# Patient Record
Sex: Female | Born: 1935 | Race: White | Hispanic: No | Marital: Married | Smoking: Never smoker
Health system: Southern US, Community
[De-identification: ages and names within clinical notes are randomized; demographics above are authoritative.]

## PROBLEM LIST (undated history)

## (undated) DIAGNOSIS — I495 Sick sinus syndrome: Secondary | ICD-10-CM

## (undated) DIAGNOSIS — I480 Paroxysmal atrial fibrillation: Secondary | ICD-10-CM

## (undated) HISTORY — DX: Paroxysmal atrial fibrillation: I48.0

## (undated) HISTORY — DX: Sick sinus syndrome: I49.5

---
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
## 1993-01-08 HISTORY — PX: EXCISION MORTON'S NEUROMA: SHX5013

## 1997-10-18 ENCOUNTER — Other Ambulatory Visit: Admission: RE | Admit: 1997-10-18 | Discharge: 1997-10-18 | Payer: Self-pay | Admitting: Family Medicine

## 1997-11-24 ENCOUNTER — Ambulatory Visit (HOSPITAL_COMMUNITY): Admission: RE | Admit: 1997-11-24 | Discharge: 1997-11-24 | Payer: Self-pay | Admitting: Family Medicine

## 1998-09-28 ENCOUNTER — Inpatient Hospital Stay (HOSPITAL_COMMUNITY): Admission: EM | Admit: 1998-09-28 | Discharge: 1998-09-30 | Payer: Self-pay | Admitting: *Deleted

## 1998-10-24 ENCOUNTER — Encounter: Admission: RE | Admit: 1998-10-24 | Discharge: 1998-10-24 | Payer: Self-pay | Admitting: Family Medicine

## 1998-12-30 ENCOUNTER — Emergency Department (HOSPITAL_COMMUNITY): Admission: EM | Admit: 1998-12-30 | Discharge: 1998-12-30 | Payer: Self-pay | Admitting: Emergency Medicine

## 1998-12-30 ENCOUNTER — Encounter: Payer: Self-pay | Admitting: Emergency Medicine

## 1999-01-06 ENCOUNTER — Emergency Department (HOSPITAL_COMMUNITY): Admission: EM | Admit: 1999-01-06 | Discharge: 1999-01-06 | Payer: Self-pay | Admitting: Emergency Medicine

## 1999-01-13 ENCOUNTER — Emergency Department (HOSPITAL_COMMUNITY): Admission: EM | Admit: 1999-01-13 | Discharge: 1999-01-13 | Payer: Self-pay | Admitting: Emergency Medicine

## 1999-01-13 ENCOUNTER — Encounter: Payer: Self-pay | Admitting: Emergency Medicine

## 1999-04-20 ENCOUNTER — Encounter: Payer: Self-pay | Admitting: Family Medicine

## 1999-04-20 ENCOUNTER — Encounter: Admission: RE | Admit: 1999-04-20 | Discharge: 1999-04-20 | Payer: Self-pay | Admitting: Family Medicine

## 1999-08-25 ENCOUNTER — Ambulatory Visit (HOSPITAL_COMMUNITY): Admission: RE | Admit: 1999-08-25 | Discharge: 1999-08-25 | Payer: Self-pay | Admitting: *Deleted

## 1999-08-25 ENCOUNTER — Encounter: Payer: Self-pay | Admitting: *Deleted

## 2000-03-26 ENCOUNTER — Ambulatory Visit (HOSPITAL_COMMUNITY): Admission: RE | Admit: 2000-03-26 | Discharge: 2000-03-26 | Payer: Self-pay | Admitting: Gastroenterology

## 2000-04-22 ENCOUNTER — Encounter: Admission: RE | Admit: 2000-04-22 | Discharge: 2000-04-22 | Payer: Self-pay | Admitting: Family Medicine

## 2000-04-22 ENCOUNTER — Encounter: Payer: Self-pay | Admitting: Family Medicine

## 2000-09-28 ENCOUNTER — Emergency Department (HOSPITAL_COMMUNITY): Admission: EM | Admit: 2000-09-28 | Discharge: 2000-09-28 | Payer: Self-pay | Admitting: Emergency Medicine

## 2000-10-10 ENCOUNTER — Ambulatory Visit (HOSPITAL_COMMUNITY): Admission: RE | Admit: 2000-10-10 | Discharge: 2000-10-10 | Payer: Self-pay | Admitting: *Deleted

## 2001-03-14 ENCOUNTER — Other Ambulatory Visit: Admission: RE | Admit: 2001-03-14 | Discharge: 2001-03-14 | Payer: Self-pay | Admitting: Family Medicine

## 2001-04-23 ENCOUNTER — Encounter: Payer: Self-pay | Admitting: Family Medicine

## 2001-04-23 ENCOUNTER — Encounter: Admission: RE | Admit: 2001-04-23 | Discharge: 2001-04-23 | Payer: Self-pay | Admitting: Family Medicine

## 2001-08-08 ENCOUNTER — Inpatient Hospital Stay (HOSPITAL_COMMUNITY): Admission: EM | Admit: 2001-08-08 | Discharge: 2001-08-08 | Payer: Self-pay | Admitting: Emergency Medicine

## 2001-08-08 ENCOUNTER — Encounter: Payer: Self-pay | Admitting: Emergency Medicine

## 2001-08-08 HISTORY — PX: CARDIAC CATHETERIZATION: SHX172

## 2002-04-27 ENCOUNTER — Encounter: Admission: RE | Admit: 2002-04-27 | Discharge: 2002-04-27 | Payer: Self-pay | Admitting: Family Medicine

## 2002-04-27 ENCOUNTER — Encounter: Payer: Self-pay | Admitting: Family Medicine

## 2003-01-27 ENCOUNTER — Emergency Department (HOSPITAL_COMMUNITY): Admission: EM | Admit: 2003-01-27 | Discharge: 2003-01-28 | Payer: Self-pay | Admitting: Emergency Medicine

## 2004-04-05 ENCOUNTER — Inpatient Hospital Stay (HOSPITAL_COMMUNITY): Admission: EM | Admit: 2004-04-05 | Discharge: 2004-04-06 | Payer: Self-pay | Admitting: Emergency Medicine

## 2004-05-24 ENCOUNTER — Encounter: Admission: RE | Admit: 2004-05-24 | Discharge: 2004-05-24 | Payer: Self-pay | Admitting: Family Medicine

## 2004-11-04 ENCOUNTER — Emergency Department (HOSPITAL_COMMUNITY): Admission: EM | Admit: 2004-11-04 | Discharge: 2004-11-04 | Payer: Self-pay | Admitting: Emergency Medicine

## 2005-11-12 ENCOUNTER — Inpatient Hospital Stay (HOSPITAL_COMMUNITY): Admission: AD | Admit: 2005-11-12 | Discharge: 2005-11-23 | Payer: Self-pay | Admitting: Cardiology

## 2005-11-13 ENCOUNTER — Encounter (INDEPENDENT_AMBULATORY_CARE_PROVIDER_SITE_OTHER): Payer: Self-pay | Admitting: Cardiovascular Disease

## 2005-11-13 ENCOUNTER — Encounter: Payer: Self-pay | Admitting: Vascular Surgery

## 2006-02-20 ENCOUNTER — Inpatient Hospital Stay (HOSPITAL_COMMUNITY): Admission: EM | Admit: 2006-02-20 | Discharge: 2006-02-25 | Payer: Self-pay | Admitting: Emergency Medicine

## 2006-04-03 ENCOUNTER — Emergency Department (HOSPITAL_COMMUNITY): Admission: EM | Admit: 2006-04-03 | Discharge: 2006-04-03 | Payer: Self-pay | Admitting: Emergency Medicine

## 2006-04-25 ENCOUNTER — Encounter: Admission: RE | Admit: 2006-04-25 | Discharge: 2006-04-25 | Payer: Self-pay | Admitting: Family Medicine

## 2006-08-24 ENCOUNTER — Inpatient Hospital Stay (HOSPITAL_COMMUNITY): Admission: EM | Admit: 2006-08-24 | Discharge: 2006-08-29 | Payer: Self-pay | Admitting: Emergency Medicine

## 2007-11-05 ENCOUNTER — Inpatient Hospital Stay (HOSPITAL_COMMUNITY): Admission: EM | Admit: 2007-11-05 | Discharge: 2007-11-06 | Payer: Self-pay | Admitting: Emergency Medicine

## 2007-11-06 ENCOUNTER — Emergency Department (HOSPITAL_COMMUNITY): Admission: EM | Admit: 2007-11-06 | Discharge: 2007-11-07 | Payer: Self-pay | Admitting: Emergency Medicine

## 2008-02-07 IMAGING — XA IR ANGIO/CAROTID/CERV BI
1 series · 16 of 24 positions shown · IV contrast (omnipaque)
Comparison: none

CLINICAL DATA: Patient with hypertension and syncope.   Abnormal MRA examination of the brain. 
FOUR-VESSEL CEREBRAL ARTERIOGRAM: 
Following the full explanation of the procedure, along with the potentially associated complications, an informed witnessed consent was obtained. 
The right groin was prepped and draped in the usual fashion.  Thereafter, using a modified Seldinger technique, transfemoral access into the right common femoral artery was obtained without difficulty.  Over a 0.035-inch guidewire, a 5-French Pinnacle sheath was inserted.  Through this and also over a 0.035-inch guidewire, a 5-French JB-1 catheter was advanced through the aortic arch region and selectively positioned in the right common carotid artery, the right vertebral artery, the left common carotid artery, and the left vertebral artery.    There were no acute complications.  The patient tolerated the procedure well. 
Medications utilized:  Versed 1 mg IV, fentanyl 25 mcg IV. 
Contrast:  Omnipaque 300, approximately 60 cc.

[Series 1: run · 16 of 232 slices shown]
[im 1/232]
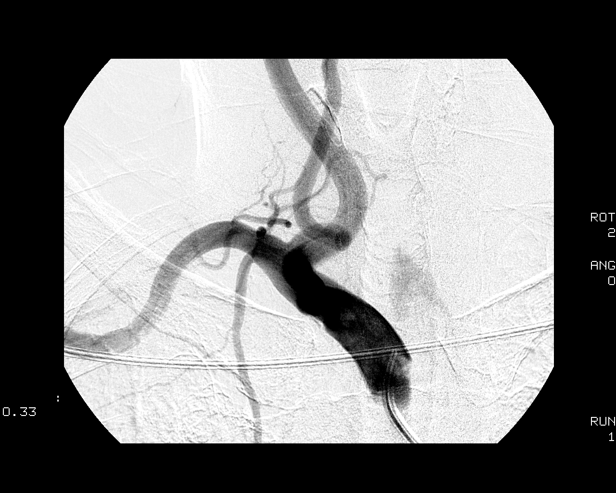
[im 21/232]
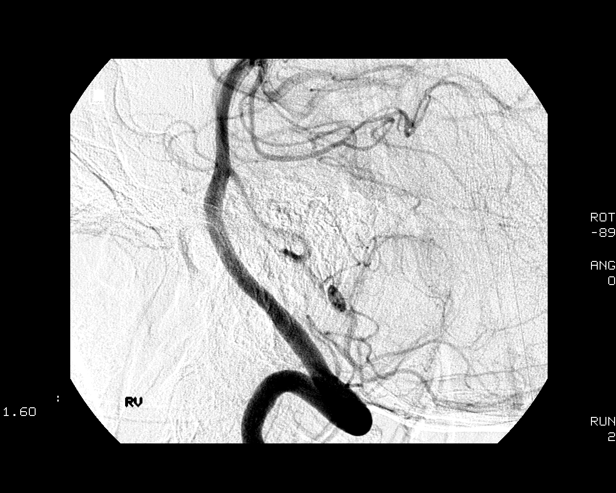
[im 31/232]
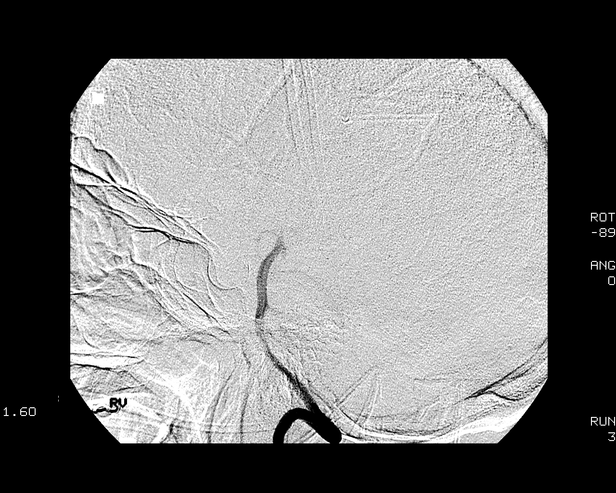
[im 51/232]
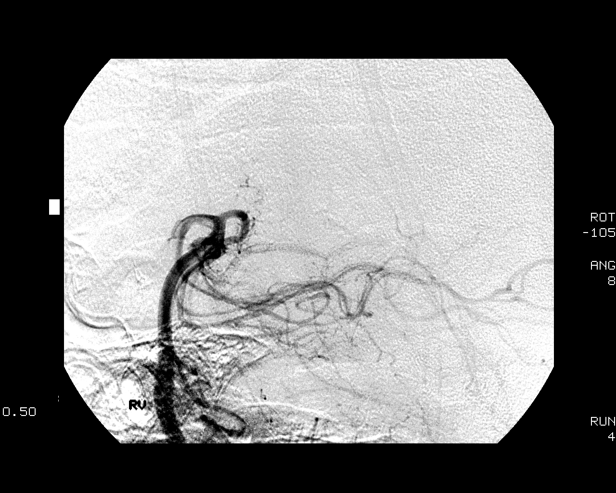
[im 61/232]
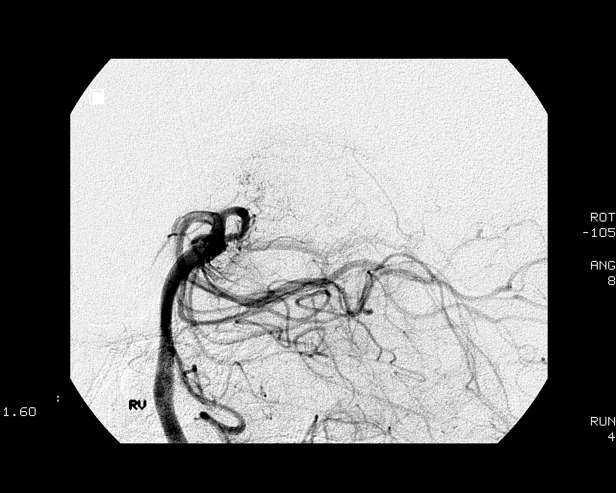
[im 81/232]
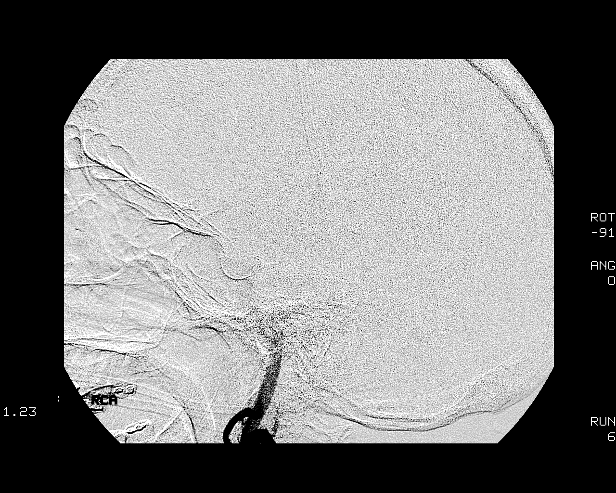
[im 91/232]
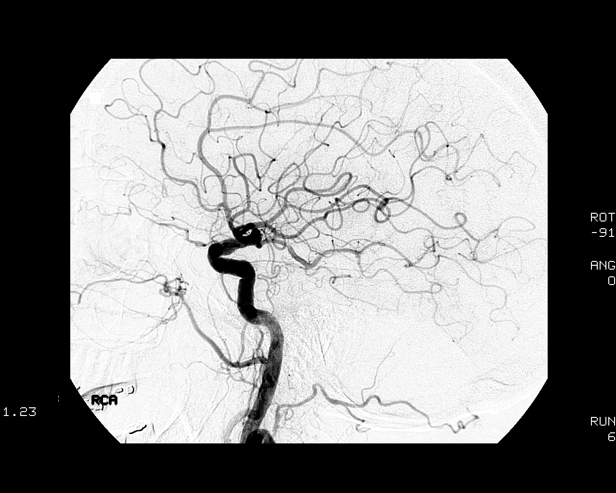
[im 111/232]
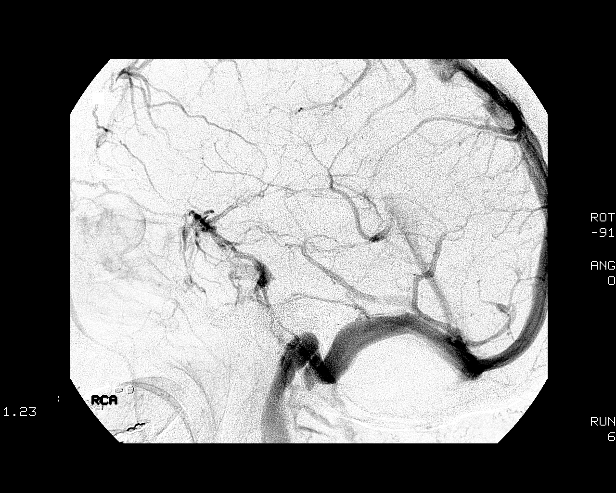
[im 121/232]
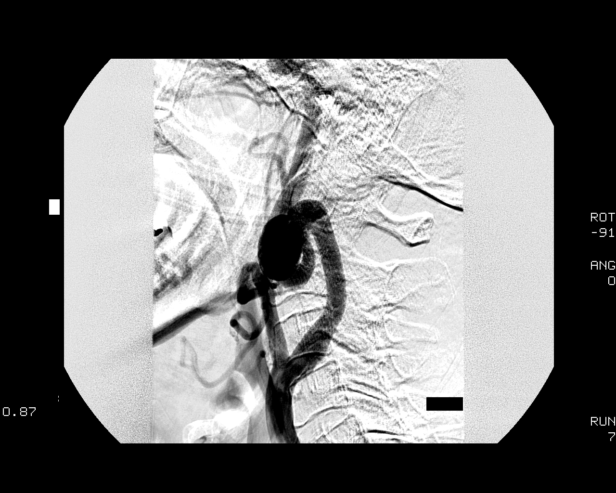
[im 141/232]
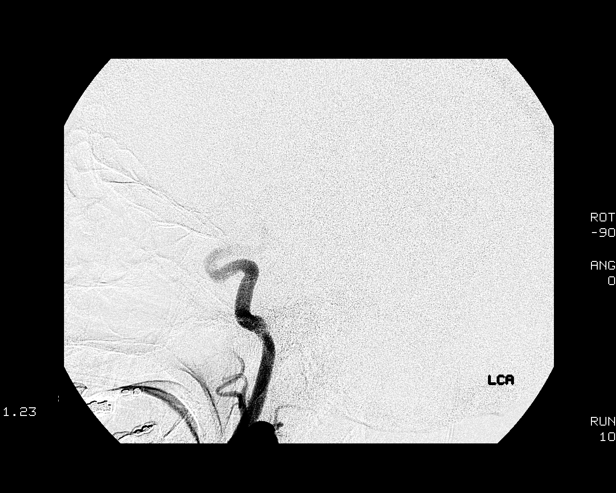
[im 151/232]
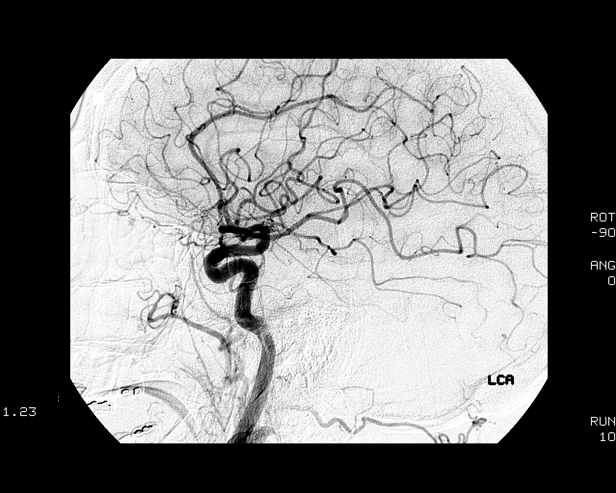
[im 171/232]
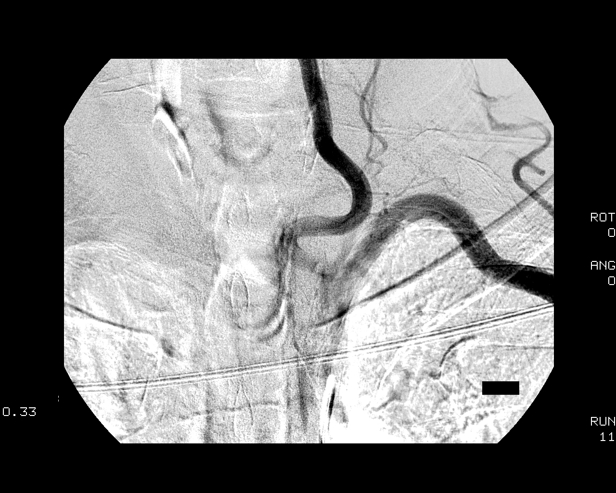
[im 181/232]
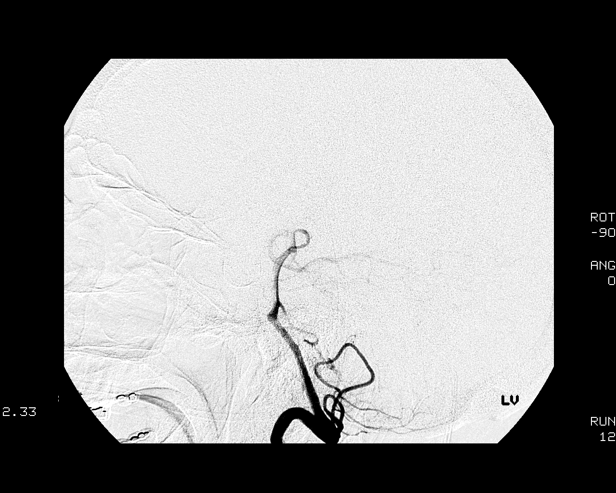
[im 201/232]
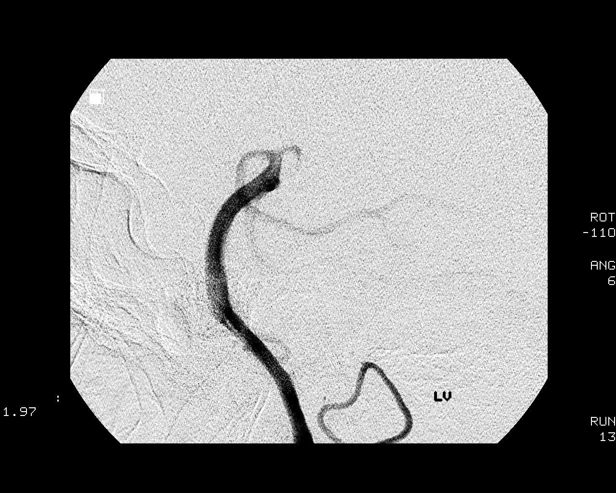
[im 211/232]
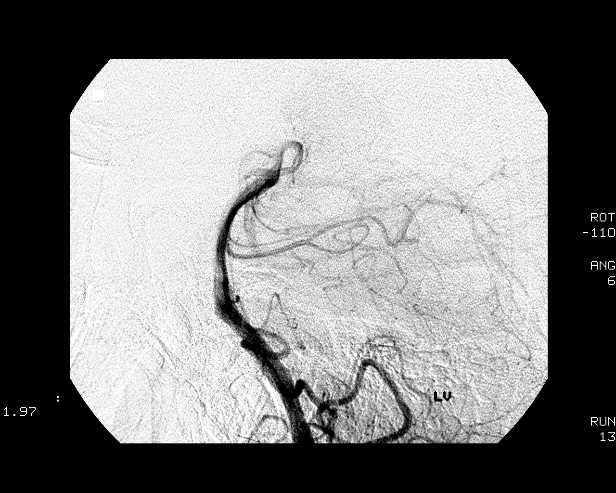
[im 232/232]
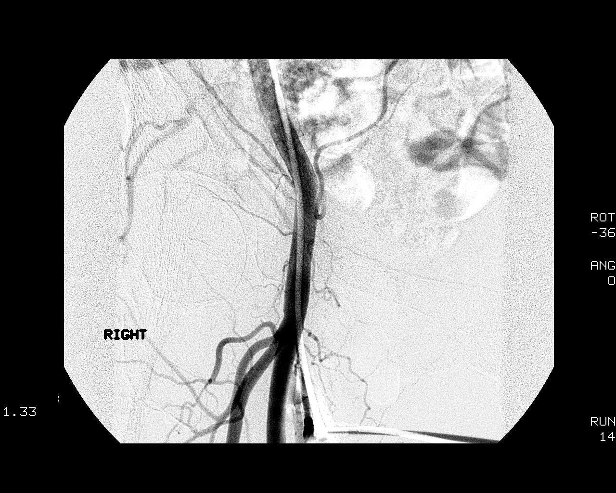

[16 of 24 positions shown; findings below may reference images not displayed]

FINDINGS: The right vertebral artery origin is normal.  The vessel has mild tortuosity proximally.  The vessel is otherwise seen to opacify normally into the cranial skull base.  Normal opacification is noted of the right posterior cerebral artery and the right vertebrobasilar junction.  The opacified portion of the basilar artery, the posterior cerebral artery on the left side, the superior cerebellar arteries, and the anterior inferior cerebellar arteries are normal into capillary and venous phases. 
The right posterior cerebral artery and distal P1 segment have a tight focal stenosis. 
Subsequent capillary and venous phases are normal. 
The right common carotid arteriogram demonstrates the right external carotid artery and its major branches to be normal.  The right internal carotid artery at the bulb is normal.  There is U-shaped tortuosity of the mid cervical portion of the right ICA.  The vessel is otherwise seen to opacify normally to the cranial skull base. 
The petrous and the cavernous segments are normally opacified.  The right ICA in the supraclinoid segment is also mildly narrowed secondary to arterial sclerosis.  The right middle and the right anterior cerebral arteries are seen to opacify normally into capillary and venous phases.  
A dominant right PCOM is noted opacifying the right PCA and right superior cerebellar arteries distributions.
The left common carotid arteriogram demonstrates the left external carotid artery and its major branches to be normal.  The left internal carotid artery at the bulb is normal.  Next to the middle one-third, there is significant tortuosity associated with an approximately 15-mm x 10-mm saccular outpouching, which projects medially and inferiorly.  Distal to this, the left internal carotid artery is seen to opacify normally to the cranial skull base.  The petrous, the cavernous, and the supraclinoid segments are normal.  The left middle cerebral artery has mild irregularity proximally.  Distally, the trifurcation branches in the right anterior cerebral artery is seen to opacify normally to capillary and venous phases. 
The left vertebral artery origin is normal.  The vessel has mild tortuosity proximally.  The vessel is otherwise seen to opacify normally to the cranial skull base.  Normal opacification is noted of the left posterior inferior cerebral artery at the left vertebrobasilar junction.  The opacified portion of the basilar artery, posterior cerebral arteries, superior cerebellar arteries, and the anterior inferior cerebellar arteries are normal into capillary and venous phases.  Focal tight stenosis is noted in the right P1 segment.
IMPRESSION: 1.  Angiographically, evidence of tapered narrowing of the right posterior cerebral artery to focal tight stenosis in the distal P1 segment.  Approximately 15-mm x 10-mm probable pseudoaneurysm involving the mid cervical portion of the left ICA.  This may represent sequela of previous dissection.
2.  Mild atherosclerotic disease involving the right ICA supraclinoid segment.

## 2008-04-04 ENCOUNTER — Ambulatory Visit: Payer: Self-pay | Admitting: Internal Medicine

## 2008-04-04 ENCOUNTER — Inpatient Hospital Stay (HOSPITAL_COMMUNITY): Admission: EM | Admit: 2008-04-04 | Discharge: 2008-04-07 | Payer: Self-pay | Admitting: Emergency Medicine

## 2008-04-05 ENCOUNTER — Encounter (INDEPENDENT_AMBULATORY_CARE_PROVIDER_SITE_OTHER): Payer: Self-pay | Admitting: Cardiology

## 2008-05-08 ENCOUNTER — Inpatient Hospital Stay (HOSPITAL_COMMUNITY): Admission: EM | Admit: 2008-05-08 | Discharge: 2008-05-14 | Payer: Self-pay | Admitting: Emergency Medicine

## 2008-05-12 HISTORY — PX: PERMANENT PACEMAKER INSERTION: SHX6023

## 2009-05-27 ENCOUNTER — Inpatient Hospital Stay (HOSPITAL_COMMUNITY): Admission: EM | Admit: 2009-05-27 | Discharge: 2009-06-03 | Payer: Self-pay | Admitting: Emergency Medicine

## 2009-06-01 ENCOUNTER — Encounter (INDEPENDENT_AMBULATORY_CARE_PROVIDER_SITE_OTHER): Payer: Self-pay | Admitting: Cardiovascular Disease

## 2009-07-09 ENCOUNTER — Inpatient Hospital Stay (HOSPITAL_COMMUNITY): Admission: EM | Admit: 2009-07-09 | Discharge: 2009-07-13 | Payer: Self-pay | Admitting: Emergency Medicine

## 2009-07-12 ENCOUNTER — Encounter (INDEPENDENT_AMBULATORY_CARE_PROVIDER_SITE_OTHER): Payer: Self-pay | Admitting: Internal Medicine

## 2010-03-26 LAB — CARDIAC PANEL(CRET KIN+CKTOT+MB+TROPI)
CK, MB: 1.8 ng/mL (ref 0.3–4.0)
Relative Index: INVALID (ref 0.0–2.5)
Total CK: 40 U/L (ref 7–177)
Total CK: 43 U/L (ref 7–177)
Total CK: 43 U/L (ref 7–177)
Troponin I: 0.03 ng/mL (ref 0.00–0.06)

## 2010-03-26 LAB — HEPATIC FUNCTION PANEL
ALT: 25 U/L (ref 0–35)
AST: 30 U/L (ref 0–37)
Total Bilirubin: 0.7 mg/dL (ref 0.3–1.2)
Total Protein: 6.6 g/dL (ref 6.0–8.3)

## 2010-03-26 LAB — URINALYSIS, ROUTINE W REFLEX MICROSCOPIC
Bilirubin Urine: NEGATIVE
Bilirubin Urine: NEGATIVE
Ketones, ur: 15 mg/dL — AB
Nitrite: NEGATIVE
Nitrite: NEGATIVE
Protein, ur: NEGATIVE mg/dL
Specific Gravity, Urine: 1.016 (ref 1.005–1.030)
Urobilinogen, UA: 0.2 mg/dL (ref 0.0–1.0)

## 2010-03-26 LAB — BASIC METABOLIC PANEL
BUN: 20 mg/dL (ref 6–23)
CO2: 30 mEq/L (ref 19–32)
Calcium: 8.6 mg/dL (ref 8.4–10.5)
Chloride: 95 mEq/L — ABNORMAL LOW (ref 96–112)
GFR calc Af Amer: >60 mL/min (ref >60)
GFR calc non Af Amer: 51 mL/min — ABNORMAL LOW (ref >60)
Potassium: 3.8 mEq/L (ref 3.5–5.1)
Sodium: 131 mEq/L — ABNORMAL LOW (ref 135–145)

## 2010-03-26 LAB — CULTURE, BLOOD (ROUTINE X 2): Culture: NO GROWTH

## 2010-03-26 LAB — COMPREHENSIVE METABOLIC PANEL
ALT: 20 U/L (ref 0–35)
AST: 22 U/L (ref 0–37)
Albumin: 2.8 g/dL — ABNORMAL LOW (ref 3.5–5.2)
Alkaline Phosphatase: 75 U/L (ref 39–117)
CO2: 25 mEq/L (ref 19–32)
Chloride: 101 mEq/L (ref 96–112)
Creatinine, Ser: 0.84 mg/dL (ref 0.4–1.2)
GFR calc Af Amer: >60 mL/min (ref >60)
GFR calc non Af Amer: >60 mL/min (ref >60)
Potassium: 3.8 mEq/L (ref 3.5–5.1)
Sodium: 135 mEq/L (ref 135–145)
Total Bilirubin: 0.5 mg/dL (ref 0.3–1.2)

## 2010-03-26 LAB — DIFFERENTIAL
Eosinophils Absolute: 0.6 10*3/uL (ref 0.0–0.7)
Eosinophils Relative: 6 % — ABNORMAL HIGH (ref 0–5)
Lymphs Abs: 0.5 10*3/uL — ABNORMAL LOW (ref 0.7–4.0)
Monocytes Relative: 5 % (ref 3–12)
Neutrophils Relative %: 84 % — ABNORMAL HIGH (ref 43–77)

## 2010-03-26 LAB — CBC
Hemoglobin: 12 g/dL (ref 12.0–15.0)
Hemoglobin: 12.6 g/dL (ref 12.0–15.0)
MCH: 31.4 pg (ref 26.0–34.0)
MCH: 31.8 pg (ref 26.0–34.0)
MCV: 91.4 fL (ref 78.0–100.0)
Platelets: 143 10*3/uL — ABNORMAL LOW (ref 150–400)
RBC: 3.82 MIL/uL — ABNORMAL LOW (ref 3.87–5.11)
RBC: 3.98 MIL/uL (ref 3.87–5.11)
WBC: 6.6 10*3/uL (ref 4.0–10.5)
WBC: 9.9 10*3/uL (ref 4.0–10.5)

## 2010-03-26 LAB — URINE CULTURE
Colony Count: 7000
Special Requests: NEGATIVE

## 2010-03-26 LAB — POCT CARDIAC MARKERS
CKMB, poc: 2.2 ng/mL (ref 1.0–8.0)
Troponin i, poc: <0.05 ng/mL (ref 0.00–0.09)

## 2010-03-26 LAB — URINE MICROSCOPIC-ADD ON

## 2010-03-27 LAB — COMPREHENSIVE METABOLIC PANEL
ALT: 137 U/L — ABNORMAL HIGH (ref 0–35)
ALT: 173 U/L — ABNORMAL HIGH (ref 0–35)
ALT: 200 U/L — ABNORMAL HIGH (ref 0–35)
ALT: 260 U/L — ABNORMAL HIGH (ref 0–35)
ALT: 450 U/L — ABNORMAL HIGH (ref 0–35)
AST: 119 U/L — ABNORMAL HIGH (ref 0–37)
AST: 79 U/L — ABNORMAL HIGH (ref 0–37)
AST: 85 U/L — ABNORMAL HIGH (ref 0–37)
Albumin: 2.7 g/dL — ABNORMAL LOW (ref 3.5–5.2)
Albumin: 2.8 g/dL — ABNORMAL LOW (ref 3.5–5.2)
Alkaline Phosphatase: 160 U/L — ABNORMAL HIGH (ref 39–117)
Alkaline Phosphatase: 183 U/L — ABNORMAL HIGH (ref 39–117)
Alkaline Phosphatase: 189 U/L — ABNORMAL HIGH (ref 39–117)
Alkaline Phosphatase: 189 U/L — ABNORMAL HIGH (ref 39–117)
BUN: 10 mg/dL (ref 6–23)
BUN: 16 mg/dL (ref 6–23)
BUN: 9 mg/dL (ref 6–23)
CO2: 24 mEq/L (ref 19–32)
Calcium: 8.4 mg/dL (ref 8.4–10.5)
Calcium: 8.5 mg/dL (ref 8.4–10.5)
Calcium: 8.7 mg/dL (ref 8.4–10.5)
Chloride: 104 mEq/L (ref 96–112)
Chloride: 106 mEq/L (ref 96–112)
Chloride: 99 mEq/L (ref 96–112)
Creatinine, Ser: 0.84 mg/dL (ref 0.4–1.2)
Creatinine, Ser: 0.89 mg/dL (ref 0.4–1.2)
Creatinine, Ser: 0.99 mg/dL (ref 0.4–1.2)
Creatinine, Ser: 1.02 mg/dL (ref 0.4–1.2)
GFR calc Af Amer: >60 mL/min (ref >60)
GFR calc Af Amer: >60 mL/min (ref >60)
GFR calc Af Amer: >60 mL/min (ref >60)
GFR calc non Af Amer: 52 mL/min — ABNORMAL LOW (ref >60)
GFR calc non Af Amer: 53 mL/min — ABNORMAL LOW (ref >60)
GFR calc non Af Amer: 60 mL/min — ABNORMAL LOW (ref >60)
Glucose, Bld: 107 mg/dL — ABNORMAL HIGH (ref 70–99)
Glucose, Bld: 114 mg/dL — ABNORMAL HIGH (ref 70–99)
Glucose, Bld: 96 mg/dL (ref 70–99)
Potassium: 3.6 mEq/L (ref 3.5–5.1)
Potassium: 3.8 mEq/L (ref 3.5–5.1)
Potassium: 4.1 mEq/L (ref 3.5–5.1)
Sodium: 133 mEq/L — ABNORMAL LOW (ref 135–145)
Sodium: 135 mEq/L (ref 135–145)
Sodium: 136 mEq/L (ref 135–145)
Sodium: 136 mEq/L (ref 135–145)
Total Bilirubin: 1.6 mg/dL — ABNORMAL HIGH (ref 0.3–1.2)
Total Bilirubin: 1.9 mg/dL — ABNORMAL HIGH (ref 0.3–1.2)
Total Protein: 5.9 g/dL — ABNORMAL LOW (ref 6.0–8.3)
Total Protein: 6.1 g/dL (ref 6.0–8.3)
Total Protein: 6.1 g/dL (ref 6.0–8.3)
Total Protein: 6.1 g/dL (ref 6.0–8.3)
Total Protein: 6.5 g/dL (ref 6.0–8.3)

## 2010-03-27 LAB — CBC
HCT: 35.2 % — ABNORMAL LOW (ref 36.0–46.0)
HCT: 35.7 % — ABNORMAL LOW (ref 36.0–46.0)
HCT: 37.5 % (ref 36.0–46.0)
HCT: 37.8 % (ref 36.0–46.0)
Hemoglobin: 12.1 g/dL (ref 12.0–15.0)
Hemoglobin: 12.3 g/dL (ref 12.0–15.0)
Hemoglobin: 12.5 g/dL (ref 12.0–15.0)
Hemoglobin: 12.8 g/dL (ref 12.0–15.0)
Hemoglobin: 12.8 g/dL (ref 12.0–15.0)
MCHC: 33.7 g/dL (ref 30.0–36.0)
MCHC: 34 g/dL (ref 30.0–36.0)
MCHC: 34.2 g/dL (ref 30.0–36.0)
MCHC: 34.5 g/dL (ref 30.0–36.0)
MCV: 91.1 fL (ref 78.0–100.0)
MCV: 91.9 fL (ref 78.0–100.0)
MCV: 92.3 fL (ref 78.0–100.0)
MCV: 93 fL (ref 78.0–100.0)
Platelets: 148 10*3/uL — ABNORMAL LOW (ref 150–400)
Platelets: 158 10*3/uL (ref 150–400)
Platelets: 176 10*3/uL (ref 150–400)
RBC: 3.86 MIL/uL — ABNORMAL LOW (ref 3.87–5.11)
RBC: 3.94 MIL/uL (ref 3.87–5.11)
RBC: 4.14 MIL/uL (ref 3.87–5.11)
RDW: 13.6 % (ref 11.5–15.5)
RDW: 13.9 % (ref 11.5–15.5)
RDW: 13.9 % (ref 11.5–15.5)
RDW: 14 % (ref 11.5–15.5)
RDW: 14 % (ref 11.5–15.5)
WBC: 11 10*3/uL — ABNORMAL HIGH (ref 4.0–10.5)
WBC: 11.8 10*3/uL — ABNORMAL HIGH (ref 4.0–10.5)
WBC: 12.8 10*3/uL — ABNORMAL HIGH (ref 4.0–10.5)

## 2010-03-27 LAB — DIFFERENTIAL
Basophils Relative: 0 % (ref 0–1)
Eosinophils Absolute: 0 10*3/uL (ref 0.0–0.7)
Eosinophils Relative: 0 % (ref 0–5)
Lymphs Abs: 0.3 10*3/uL — ABNORMAL LOW (ref 0.7–4.0)
Monocytes Relative: 6 % (ref 3–12)

## 2010-03-27 LAB — URINALYSIS, ROUTINE W REFLEX MICROSCOPIC
Glucose, UA: NEGATIVE mg/dL
Ketones, ur: NEGATIVE mg/dL
Protein, ur: NEGATIVE mg/dL
Urobilinogen, UA: 1 mg/dL (ref 0.0–1.0)

## 2010-03-27 LAB — LIPASE, BLOOD: Lipase: 26 U/L (ref 11–59)

## 2010-03-27 LAB — LIPID PANEL
Cholesterol: 180 mg/dL (ref 0–200)
HDL: 69 mg/dL (ref >39)
Triglycerides: 43 mg/dL (ref <150)

## 2010-03-27 LAB — PROTIME-INR
INR: 0.92 (ref 0.00–1.49)
INR: 1.04 (ref 0.00–1.49)
Prothrombin Time: 12.3 seconds (ref 11.6–15.2)
Prothrombin Time: 13.5 seconds (ref 11.6–15.2)

## 2010-03-27 LAB — HEPARIN LEVEL (UNFRACTIONATED)
Heparin Unfractionated: 0.38 IU/mL (ref 0.30–0.70)
Heparin Unfractionated: 0.51 IU/mL (ref 0.30–0.70)

## 2010-03-27 LAB — BASIC METABOLIC PANEL
BUN: 14 mg/dL (ref 6–23)
BUN: 16 mg/dL (ref 6–23)
CO2: 24 mEq/L (ref 19–32)
CO2: 26 mEq/L (ref 19–32)
Calcium: 8.5 mg/dL (ref 8.4–10.5)
Chloride: 97 mEq/L (ref 96–112)
Creatinine, Ser: 0.94 mg/dL (ref 0.4–1.2)
GFR calc Af Amer: 60 mL/min — ABNORMAL LOW (ref >60)
Glucose, Bld: 138 mg/dL — ABNORMAL HIGH (ref 70–99)
Potassium: 3.8 mEq/L (ref 3.5–5.1)

## 2010-03-27 LAB — HEPATIC FUNCTION PANEL
Albumin: 3.3 g/dL — ABNORMAL LOW (ref 3.5–5.2)
Total Bilirubin: 3.8 mg/dL — ABNORMAL HIGH (ref 0.3–1.2)
Total Protein: 6.4 g/dL (ref 6.0–8.3)

## 2010-03-27 LAB — HEMOGLOBIN A1C
Hgb A1c MFr Bld: 5.9 % — ABNORMAL HIGH (ref <5.7)
Mean Plasma Glucose: 123 mg/dL — ABNORMAL HIGH (ref <117)

## 2010-03-27 LAB — URINE MICROSCOPIC-ADD ON

## 2010-03-27 LAB — CK TOTAL AND CKMB (NOT AT ARMC)
CK, MB: 3.3 ng/mL (ref 0.3–4.0)
Total CK: 76 U/L (ref 7–177)

## 2010-03-27 LAB — POCT CARDIAC MARKERS: Troponin i, poc: <0.05 ng/mL (ref 0.00–0.09)

## 2010-03-27 LAB — TROPONIN I: Troponin I: 0.05 ng/mL (ref 0.00–0.06)

## 2010-04-18 LAB — CARDIAC PANEL(CRET KIN+CKTOT+MB+TROPI)
CK, MB: 2.7 ng/mL (ref 0.3–4.0)
Relative Index: INVALID (ref 0.0–2.5)
Total CK: 62 U/L (ref 7–177)
Troponin I: 0.02 ng/mL (ref 0.00–0.06)

## 2010-04-18 LAB — DIFFERENTIAL
Basophils Absolute: 0 10*3/uL (ref 0.0–0.1)
Basophils Relative: 1 % (ref 0–1)
Eosinophils Absolute: 0.1 10*3/uL (ref 0.0–0.7)
Eosinophils Relative: 1 % (ref 0–5)
Monocytes Absolute: 0.4 10*3/uL (ref 0.1–1.0)
Monocytes Relative: 6 % (ref 3–12)
Neutro Abs: 5.8 10*3/uL (ref 1.7–7.7)

## 2010-04-18 LAB — CBC
Hemoglobin: 11.1 g/dL — ABNORMAL LOW (ref 12.0–15.0)
Hemoglobin: 11.5 g/dL — ABNORMAL LOW (ref 12.0–15.0)
MCHC: 34.2 g/dL (ref 30.0–36.0)
MCHC: 34.6 g/dL (ref 30.0–36.0)
MCHC: 34.6 g/dL (ref 30.0–36.0)
MCV: 90.9 fL (ref 78.0–100.0)
MCV: 90.9 fL (ref 78.0–100.0)
MCV: 91.8 fL (ref 78.0–100.0)
Platelets: 192 10*3/uL (ref 150–400)
RBC: 3.54 MIL/uL — ABNORMAL LOW (ref 3.87–5.11)
RBC: 3.74 MIL/uL — ABNORMAL LOW (ref 3.87–5.11)
RBC: 3.94 MIL/uL (ref 3.87–5.11)
RDW: 14.1 % (ref 11.5–15.5)
WBC: 6.3 10*3/uL (ref 4.0–10.5)

## 2010-04-18 LAB — BASIC METABOLIC PANEL
BUN: 27 mg/dL — ABNORMAL HIGH (ref 6–23)
CO2: 28 mEq/L (ref 19–32)
CO2: 28 mEq/L (ref 19–32)
CO2: 28 mEq/L (ref 19–32)
CO2: 29 mEq/L (ref 19–32)
Calcium: 8.9 mg/dL (ref 8.4–10.5)
Calcium: 9.1 mg/dL (ref 8.4–10.5)
Chloride: 103 mEq/L (ref 96–112)
Chloride: 104 mEq/L (ref 96–112)
Chloride: 105 mEq/L (ref 96–112)
Creatinine, Ser: 1.14 mg/dL (ref 0.4–1.2)
Creatinine, Ser: 1.48 mg/dL — ABNORMAL HIGH (ref 0.4–1.2)
GFR calc Af Amer: 40 mL/min — ABNORMAL LOW (ref >60)
GFR calc Af Amer: 47 mL/min — ABNORMAL LOW (ref >60)
GFR calc Af Amer: 49 mL/min — ABNORMAL LOW (ref >60)
Glucose, Bld: 98 mg/dL (ref 70–99)
Potassium: 4.1 mEq/L (ref 3.5–5.1)
Sodium: 135 mEq/L (ref 135–145)
Sodium: 136 mEq/L (ref 135–145)
Sodium: 138 mEq/L (ref 135–145)

## 2010-04-18 LAB — URINALYSIS, ROUTINE W REFLEX MICROSCOPIC
Hgb urine dipstick: NEGATIVE
Ketones, ur: NEGATIVE mg/dL
Protein, ur: NEGATIVE mg/dL
Urobilinogen, UA: 0.2 mg/dL (ref 0.0–1.0)

## 2010-04-18 LAB — CK TOTAL AND CKMB (NOT AT ARMC)
Relative Index: INVALID (ref 0.0–2.5)
Total CK: 63 U/L (ref 7–177)

## 2010-04-18 LAB — PROTIME-INR: INR: 1 (ref 0.00–1.49)

## 2010-04-18 LAB — APTT: aPTT: 26 seconds (ref 24–37)

## 2010-04-20 LAB — COMPREHENSIVE METABOLIC PANEL
ALT: 19 U/L (ref 0–35)
AST: 20 U/L (ref 0–37)
CO2: 25 mEq/L (ref 19–32)
Calcium: 8.8 mg/dL (ref 8.4–10.5)
Creatinine, Ser: 1.01 mg/dL (ref 0.4–1.2)
GFR calc Af Amer: >60 mL/min (ref >60)
GFR calc non Af Amer: 52 mL/min — ABNORMAL LOW (ref >60)
Sodium: 136 mEq/L (ref 135–145)
Total Protein: 6.5 g/dL (ref 6.0–8.3)

## 2010-04-20 LAB — DIFFERENTIAL
Basophils Absolute: 0 10*3/uL (ref 0.0–0.1)
Basophils Relative: 0 % (ref 0–1)
Eosinophils Absolute: 0 10*3/uL (ref 0.0–0.7)
Eosinophils Absolute: 0.1 10*3/uL (ref 0.0–0.7)
Eosinophils Relative: 0 % (ref 0–5)
Lymphocytes Relative: 5 % — ABNORMAL LOW (ref 12–46)
Lymphs Abs: 0.6 10*3/uL — ABNORMAL LOW (ref 0.7–4.0)
Monocytes Relative: 4 % (ref 3–12)
Neutrophils Relative %: 75 % (ref 43–77)
Neutrophils Relative %: 90 % — ABNORMAL HIGH (ref 43–77)

## 2010-04-20 LAB — CBC
HCT: 36.2 % (ref 36.0–46.0)
Hemoglobin: 12.4 g/dL (ref 12.0–15.0)
MCHC: 34.1 g/dL (ref 30.0–36.0)
MCV: 91.8 fL (ref 78.0–100.0)
Platelets: 150 10*3/uL (ref 150–400)
Platelets: 151 10*3/uL (ref 150–400)
Platelets: 153 10*3/uL (ref 150–400)
Platelets: 159 10*3/uL (ref 150–400)
RBC: 4.32 MIL/uL (ref 3.87–5.11)
RDW: 13.3 % (ref 11.5–15.5)
RDW: 13.5 % (ref 11.5–15.5)
WBC: 11.6 10*3/uL — ABNORMAL HIGH (ref 4.0–10.5)

## 2010-04-20 LAB — URINALYSIS, ROUTINE W REFLEX MICROSCOPIC
Glucose, UA: NEGATIVE mg/dL
Ketones, ur: 15 mg/dL — AB
Protein, ur: 30 mg/dL — AB
pH: 7.5 (ref 5.0–8.0)

## 2010-04-20 LAB — POCT CARDIAC MARKERS
CKMB, poc: 2.8 ng/mL (ref 1.0–8.0)
Troponin i, poc: <0.05 ng/mL (ref 0.00–0.09)

## 2010-04-20 LAB — TROPONIN I: Troponin I: 0.02 ng/mL (ref 0.00–0.06)

## 2010-04-20 LAB — URINE MICROSCOPIC-ADD ON

## 2010-04-20 LAB — BASIC METABOLIC PANEL
BUN: 17 mg/dL (ref 6–23)
BUN: 21 mg/dL (ref 6–23)
CO2: 25 mEq/L (ref 19–32)
Calcium: 8.7 mg/dL (ref 8.4–10.5)
Calcium: 8.9 mg/dL (ref 8.4–10.5)
Chloride: 107 mEq/L (ref 96–112)
Creatinine, Ser: 1.04 mg/dL (ref 0.4–1.2)
GFR calc non Af Amer: 46 mL/min — ABNORMAL LOW (ref >60)
GFR calc non Af Amer: 50 mL/min — ABNORMAL LOW (ref >60)
Glucose, Bld: 89 mg/dL (ref 70–99)
Glucose, Bld: 94 mg/dL (ref 70–99)
Sodium: 137 mEq/L (ref 135–145)

## 2010-04-20 LAB — MAGNESIUM: Magnesium: 1.9 mg/dL (ref 1.5–2.5)

## 2010-04-20 LAB — PROTIME-INR: Prothrombin Time: 13.3 seconds (ref 11.6–15.2)

## 2010-04-20 LAB — CARDIAC PANEL(CRET KIN+CKTOT+MB+TROPI)
CK, MB: 2 ng/mL (ref 0.3–4.0)
CK, MB: 2.5 ng/mL (ref 0.3–4.0)
Relative Index: INVALID (ref 0.0–2.5)
Troponin I: 0.04 ng/mL (ref 0.00–0.06)

## 2010-04-20 LAB — CK TOTAL AND CKMB (NOT AT ARMC)
Relative Index: INVALID (ref 0.0–2.5)
Total CK: 78 U/L (ref 7–177)

## 2010-04-20 LAB — BRAIN NATRIURETIC PEPTIDE: Pro B Natriuretic peptide (BNP): 440 pg/mL — ABNORMAL HIGH (ref 0.0–100.0)

## 2010-04-20 LAB — HEPARIN LEVEL (UNFRACTIONATED): Heparin Unfractionated: 0.61 IU/mL (ref 0.30–0.70)

## 2010-04-20 LAB — TSH: TSH: 1.537 u[IU]/mL (ref 0.350–4.500)

## 2010-05-23 NOTE — Discharge Summary (Signed)
[name], [name]             ACCOUNT NO.:  [id number]   MEDICAL RECORD NO.:  [id number]          PATIENT TYPE:  INP   LOCATION:  2013                         FACILITY:  [facility]   PHYSICIAN:  Ritta Slot, MD     DATE OF BIRTH:  10/01/1918   DATE OF ADMISSION:  05/08/2008  DATE OF DISCHARGE:  05/14/2008                               DISCHARGE SUMMARY   DISCHARGE DIAGNOSES:  1. Syncope status post [employer] pacemaker implant and loop recorder      explant this admission on May 12, 2008.  2. Paroxysmal atrial fibrillation sick sinus syndrome.  3. Treated hypertension.  4. Dementia.   HOSPITAL COURSE:  The patient is an 77 year old female who lives with  her son and daughter.  She has a history of syncope.  She had a loop  recorder implant in March 2010.  She did have some bradycardia and some  PSVT and PAF.  Those episodes lasted less than 10 seconds.  She was  admitted May 08, 2008, after a syncopal spell.  She had been on  amiodarone and metoprolol for PAF.  She was bradycardic in the emergency  room, heart rates in the 30s.  She was admitted, started on Lovenox.  After discussion with her daughter who is her power of attorney, it was  decided to proceed with elective pacemaker implant and loop recorder  explant, this was done on May 12, 2008, by Dr. Lynnea Ferrier.  She had a  [employer] device inserted.  She tolerated procedure well and we feel she  can be discharged May 14, 2008.   LABS AT DISCHARGE:  White count 6.71, hemoglobin 10.6, hematocrit 31.4,  platelets 253.  Sodium 141, potassium 3.6, BUN 12, creatinine 1.38.  Chest x-ray at discharge shows no pneumothorax.  Other labs this  admission showed TSH of 2.6.  Troponins are negative.  Telemetry is  paced.   DISPOSITION:  The patient is discharged in stable condition and will  follow up with Dr. Lynnea Ferrier.   Discharge medications will be Lopressor 25 mg b.i.d., Namenda 10 mg  b.i.d., Norvasc 10 mg a day, aspirin 325 mg a day,  Cozaar 25 mg a day,  Prilosec 20 mg a day.  Dr. Lynnea Ferrier did not resume her amiodarone at this  time.      Abelino Derrick, P.A.      Ritta Slot, MD  Electronically Signed    LKK/MEDQ  D:  05/14/2008  T:  05/15/2008  Job:  [id number]

## 2010-05-23 NOTE — Consult Note (Signed)
NAMEJENSYN, [name]             ACCOUNT NO.:  [id number]   MEDICAL RECORD NO.:  [id number]          Carr TYPE:  INP   LOCATION:  3701                         FACILITY:  [facility]   PHYSICIAN:  Hillis Range, MD       DATE OF BIRTH:  10-02-1918   DATE OF CONSULTATION:  DATE OF DISCHARGE:                                 CONSULTATION   REASON FOR CONSULTATION:  Syncope.   HISTORY OF PRESENT ILLNESS:  [name] is a pleasant 77 year old  female with a history of dementia, hypertension, orthostatic  hypotension, and a left bundle-branch block, who was admitted following  2 syncopal episodes.  [name] reports being in a usual state of  health on early April 04, 2008.  She reports walking in her kitchen when  she developed abrupt loss of consciousness.  She reports waking on Victoria  floor after an undetermined period of time.  She denies any presyncopal  symptoms and notes that when she had wakened that she had returned to  her baseline health state.  She called her son and he came to her house.  Shortly thereafter she was observed to have a staring spell while in  Victoria bathroom.  He lowered [name] to Victoria ground and noted that she  had lost her consciousness for approximately 20-30 seconds.  EMS was  called and upon their arrival, [name] was noted to be  hemodynamically stable and has returned to baseline state of health.  She was admitted to [facility] for further evaluation and management.  Her workup has been unrevealing thus far.   [name] reports having prior syncopal episodes for which she has  previously been evaluated.  Apparently in October 2009, she had a  CardioNet monitor placed following a syncopal episode, which recorded  only paroxysmal atrial fibrillation and short runs as well as sinus with  PACs.  No other arrhythmias were documented.  An ejection fraction in  November 2007 revealed a preserved ejection fraction.  [name] also  has had carotid  Dopplers which have shown less than 50% stenosis.  A  left heart catheterization has previously shown nonobstructive coronary  artery disease.  She presently is otherwise without complaint.   PAST MEDICAL HISTORY:  1. Dementia.  2. Hypertension.  3. Orthostatic hypotension.  4. Paroxysmal atrial fibrillation.  5. Left bundle-branch block.  6. Chronic urinary tract infections.  7. Left heart catheterization 2003 revealed nonobstructive coronary      artery disease.   ALLERGIES:  PENICILLIN, CODEINE, and ACE INHIBITORS.   HOME MEDICATIONS:  1. Namenda 10 mg b.i.d.  2. Prilosec 20 mg daily.  3. Metoprolol 25 mg q.a.m. and 12.5 mg q.p.m.  4. Aspirin 162 mg daily.  5. Amlodipine 5 mg daily.  6. Cozaar 25 mg nightly.  7. Multivitamin daily.   FAMILY HISTORY:  Coronary artery disease.   SOCIAL HISTORY:  She is widowed and lives alone.  She denies tobacco,  alcohol, or drug use.   FAMILY HISTORY:  All systems are reviewed and negative except as  outlined in Victoria HPI above.  Telemetry reveals sinus rhythm with no arrhythmias.   PHYSICAL EXAMINATION:  VITAL SIGNS:  Blood pressure 117/52, heart rate  57, respirations 18, and sats 96% on room air, afebrile.  GENERAL:  [name] is an elderly female in no acute distress.  She is  alert and oriented x3.  HEENT:  Normocephalic and atraumatic.  Sclerae clear.  Conjunctivae  pink.  Oropharynx clear.  NECK:  Supple.  No JVD, thyromegaly, or bruits.  LUNGS:  Clear to auscultation bilaterally.  HEART:  Regular rate and rhythm, 2/6 systolic ejection murmur along Victoria  left sternal border.  GI:  Soft, nontender, nondistended.  Positive bowel sounds.  EXTREMITIES:  No clubbing, cyanosis, or edema.  NEUROLOGIC:  Strength and sensation are intact.  SKIN:  No ecchymoses or lacerations.  MUSCULOSKELETAL:  No deformity or atrophy.  PSYCH:  Euthymic mood.  Full affect.   LABORATORY DATA:  TSH 1.5.  BNP 497.  Hematocrit 32.  Creatinine 1.   Cardiac markers negative.  INR 1.  Urinalysis reveals UTI.   Transthoracic echocardiogram on April 05, 2008, reveals a left  ventricular end-diastolic dimension 42, IVS 10, posterior wall 12, left  atrial size 33, left ventricular ejection fraction 45-55% with a  systolic function at Victoria lower limit of normal, mild mitral  regurgitation, mild aortic regurgitation.   EKG reveals sinus rhythm with a left bundle-branch block.  Victoria PR  duration was 164 milliseconds.  Victoria QT interval is 460 milliseconds.   IMPRESSION:  [name] is a pleasant elderly female with multiple  comorbidities as outlined above, who presents with recurrent unexplained  syncope.  Her workup thus far has been unremarkable.  She has had no  arrhythmias on monitor and her ejection fraction is at Victoria lower limits  of normal.  She has a history of orthostatic hypotension, however, her  blood pressure presently appears to be well controlled.  Victoria etiology  for her event is not completely clear at this time.  It certainly could  be possible that she has had arrhythmias either bradycardia or  tachycardia as Victoria cause for her episode.  She is maintained chronically  on amiodarone.  I will treat her with amiodarone 200 mg daily.  I think  that we should maintain adequate hydration and encourage behavioral  modifications related to orthostatic hypotension including rising slowly  from a seated position and avoid prolonged standing in a single  position.  I think Victoria most prudent course of action at this time would  be to proceed with an implantable loop recorder to evaluate for any  arrhythmias.  I have explained this in detail with [name] and her  son.  [name]'s son agrees that this would be a reasonable approach,  however, [name] would like to further contemplate this issue and  readdress it in Victoria morning.  I have therefore not scheduled [name]  for an implantable  loop recorder at this time.  However,  if she wishes to proceed with Victoria  procedure, then we could plan on loop recorder implantation in Victoria  morning.  No other electrophysiologic evaluation is necessary at this  time.  [name] is noted to be DNI/DNR long term.      Hillis Range, MD  Electronically Signed     JA/MEDQ  D:  04/05/2008  T:  04/06/2008  Job:  [id number]   cc:   Sheliah Mends, MD

## 2010-05-23 NOTE — Op Note (Signed)
[name], [name]             ACCOUNT NO.:  [id number]   MEDICAL RECORD NO.:  [id number]          PATIENT TYPE:  INP   LOCATION:  3701                         FACILITY:  [facility]   PHYSICIAN:  Hillis Range, MD       DATE OF BIRTH:  Apr 16, 1918   DATE OF PROCEDURE:  04/04/2008  DATE OF DISCHARGE:                               OPERATIVE REPORT   SURGEON:  Hillis Range, MD   PREPROCEDURE DIAGNOSIS:  Unexplained syncope.   POSTPROCEDURE DIAGNOSIS:  Unexplained syncope.   PROCEDURES:  Implantable loop recorder implantation.   INTRODUCTION:  [name] is a pleasant 77 year old female with  recurrent unexplained syncope.  She was admitted on April 04, 2008,  after having 2 episodes of sudden onset syncope within her home without  precipitating symptoms.  She had a similar episode of syncope in October  2009.  An event monitor placed at that time was unrevealing and she did  not have syncope while wearing the monitor.  Her subsequent workup has  been unremarkable.  She therefore presents for implantable loop recorder  implantation.   DESCRIPTION OF PROCEDURE:  Informed written consent was obtained and the  patient was brought to the electrophysiology lab in the fasting state.  Mapping of the patient's chest was performed by [employer]  representatives to ascertain the best position for a Reveal monitor  implantation.  The patient's entire left chest was then prepped and  draped in the usual sterile fashion by the EP lab staff.  The patient  did not receive anesthesia.  The skin overlying the left parasternal  region was infiltrated with lidocaine for local analgesia.  A 1.5-cm  incision was made approximately 2 cm left to the left parasternal border  along the third intercostal space near the region where the patient was  found to have the best mapping with her Reveal monitor.  Electrocautery  was used to assure hemostasis.  Using a combination of blunt and sharp  dissection, a  subcutaneous pocket was fashioned in a standard fashion.  Electrocautery was used to assure hemostasis.  The pocket was then  infiltrated with gentamicin antibiotic solution.  A Medtronic Reveal XT  model [id number] (serial number [id number]) implantable loop recorder was  placed into the subcutaneous pocket and secured to the pectoralis fascia  using 2 separate #2 silk sutures.  The pocket was then closed in 2  layers with 2.0 Vicryl suture for the subcutaneous and subcuticular  layers.  Steri-Strips and a sterile dressing were then applied.  There  were no early apparent complications.  Interrogation of the Reveal  device revealed prominent R-waves and a small-sized P-waves.   CONCLUSIONS:  1. Recurrent unexplained syncope.  2. Successful implantation of a Medtronic Reveal XT implantable loop      recorder.  3. No early apparent complications.      Hillis Range, MD  Electronically Signed     JA/MEDQ  D:  04/06/2008  T:  04/07/2008  Job:  [id number]

## 2010-05-23 NOTE — Discharge Summary (Signed)
[name], [name]             ACCOUNT NO.:  [id number]   MEDICAL RECORD NO.:  [id number]          PATIENT TYPE:  INP   LOCATION:  3701                         FACILITY:  [facility]   PHYSICIAN:  Sheliah Mends, MD      DATE OF BIRTH:  [birth year]   DATE OF ADMISSION:  04/04/2008  DATE OF DISCHARGE:  04/07/2008                               DISCHARGE SUMMARY   DISCHARGE DIAGNOSES:  1. Syncope.  2. Paroxysmal atrial fibrillation with rapid ventricular response      discharged in sinus rhythm.  3. Not Coumadin candidate secondary to history of syncope and falls.  4. Previous multiple episodes of syncope in the past at times related      to orthostasis.  5. Bruise left knee.  6. Urinary tract infection on Cipro.  7. History of paroxysmal supraventricular tachycardia.   DISCHARGE CONDITION:  Improved.   PROCEDURES:  Implantation of a loop recorder by Dr. Johney Frame.   DISCHARGE MEDICATIONS:  1. Namenda 10 mg twice a day.  2. Fortical nasal spray once daily.  3. Aspirin 162 mg daily.  4. Cozaar 25 mg daily.  5. Prilosec 10 mg daily.  6. Multivitamin one daily.  7. Lopressor 12.5 mg at bedtime.  8. Metoprolol tartrate 25 mg every a.m.  9. Amlodipine 5 mg daily.  10.Tylenol Arthritis 1 tablet twice a day.  11.Cipro 250 mg one twice a day for four more days.  12.Amiodarone 200 mg tablets 2 tablets twice a day through April 11, 2008, then 2 tablets daily for 10 days until April 22, 2008 then      one daily of 200 mg.   DISCHARGE INSTRUCTIONS:  1. Increase activity slowly.  No lifting for 1 week.  She does not      drive.  2. Low-sodium heart-healthy diet.  3. Do not get site wet, no showers until April 15, 2008, call if      swelling or bleeding.  4. Follow up with Dr. Elsie Lincoln on Wednesday April 14, 2008 at 10:30 a.m.   HISTORY OF PRESENT ILLNESS:  An 77 year old female who was brought to  the emergency room on April 04, 2008 after two syncopal events.  First,  the patient was  home alone and her son called and she told him she had  passed out.  He came over.  She was up talking and acting normal.  She  went to the toilette, started to bend over to position herself, had a  blank stare and became more unresponsive.  He caught her before she  injured herself though from her initial fall, she had bruising of the  left medial knee.   The patient had several episodes of syncope in the past.  The most  recent was October 2009.  She got a CardioNet monitor.  She had brief  short runs of PAF and otherwise sinus rhythm with PAC.  Carotid Dopplers  revealed less than 49% diameter reduction in the right and left internal  carotid.  She has had a history of PSVT in the past.  Also a history  of  orthostasis episodically.  Last echo was November 2007 EF was 60%,  mildly calcified mitral valve regurgitation.  Here at [facility] a  repeat echo was without much change.   In the emergency room, she had a UTI and was placed on IV Levaquin and  this was changed to p.o. Cipro.  She was in atrial fib with rapid  ventricular response, nonspecific interventricular conduction delay and  LVH.  She also had a left axis deviation.  She was started on IV  Cardizem 5 mg bolus and then she was put on IV amiodarone.  She was also  given IV Cipro for her UTI.   PAST MEDICAL HISTORY:  1. Dementia.  2. Likely chronic UTIs.   ALLERGIES:  PENICILLIN, intolerance to CODEINE, ACE INHIBITORS cause  cough.   The patient was admitted.  EP consult was obtained for syncopal episodes  of PAF and a loop recorder was placed by Dr. Johney Frame on April 06, 2008.   By April 07, 2008 she was stable, ready for discharge home and asking to  go home.   The patient is not felt to be a Coumadin candidate secondary to history  of syncope and unsure of reason.  At this time, she was in AFib,  previously, she has not been.  She will follow up with Dr. Elsie Lincoln as an  outpatient.   VITAL SIGNS AT DISCHARGE:  Blood  pressure 120-150 over 50 to 70,  initially, she was mildly orthostatic, pulse 60, respiratory rate 18,  oxygen saturation on room air 95%.  Orthostatic blood pressures these  were done on morning of discharge, lying blood pressure was 49/61 with a  pulse of 56, sitting 132/70 and standing 110/69 with a pulse of 54.   On exam, bandage site stable with small hematoma.  Lungs were clear.  Heart regular rate and rhythm.   LABORATORY VALUES:  CBC on admission WBC 11.6, hemoglobin 13.5,  hematocrit 39.6, platelets 159.  Cardiac marker was negative.  Followup  hemoglobin 12.4, hematocrit 36, WBC 7.1.  On discharge hemoglobin 12.4,  hematocrit 36, platelets 153, WBC 6.4.  BMET at discharge sodium 137,  potassium 3.6, chloride 103, CO2 26, glucose 89, BUN 21, creatinine  1.05.  BNP was 447.   TSH 1.537.   Magnesium was 1.9.  PTT was 29, PT 13, INR 1.  Urinalysis turbid large  amount of blood, small amount of bilirubin, positive nitrites, large  leukocytes, WBCs 3-6, RBCs 11-20.  No current culture was done.   Chest x-ray on admission PA and lateral no acute cardiopulmonary  abnormalities, cardiac enlargement without heart failure, COPD.  EKG  AFib, rapid ventricular response, heart rate 137 on IV amiodarone.  The  patient converted to sinus rhythm.  No acute EKG changes otherwise.   Two-D echo EF 45-55%.  Estimated EF 50%.  Aortic valve thickness was  mildly increased, mild aortic valve regurgitation, mild mitral valve  regurgitation.  The patient will ambulate prior to discharge is stable  and discharged home.  Will follow up as an outpatient as stated.      Darcella Gasman. Annie Paras, N.P.      Sheliah Mends, MD  Electronically Signed    LRI/MEDQ  D:  04/07/2008  T:  04/08/2008  Job:  [id number]

## 2010-05-23 NOTE — H&P (Signed)
**Note [name] via Obfuscation** [name], [name]             ACCOUNT NO.:  [id number]   MEDICAL RECORD NO.:  [id number]          PATIENT TYPE:  EMS   LOCATION:  MAJO                         FACILITY:  [facility]   PHYSICIAN:  Nanetta Batty, M.D.   DATE OF BIRTH:  [birth year]   DATE OF ADMISSION:  11/05/2007  DATE OF DISCHARGE:                              HISTORY & PHYSICAL   CHIEF COMPLAINT:  Syncopal spell.   HISTORY OF PRESENT ILLNESS:  [name] is an 77 year old female who  was previously followed by Dr. Jenne Campus, she has now been established  with Dr. Elsie Lincoln.  Her primary doctor is Dr. Jacklynn Lewis.  She has a history  of recurrent syncopal spells over the years that have been unclear as to  the etiology.  Catheterization in 2003 showed nonobstructive coronary  disease.  Her last echocardiogram in November of 2007 showed good LV  function.  She was last admitted for a syncopal spell in August of 2008.  Today the patient was at home sitting in a chair at the kitchen.  The  family left the room and when they came back they found her slumped over  the table.  She was not diaphoretic.  Initially her son thought she had  just fallen asleep but apparently he was unable to arouse her.  EMS was  called and by the time they got there she was aroused.  The patient  denies any tachycardia, chest pain, shortness of breath.  She is  admitted now for observation.   PAST MEDICAL HISTORY:  Her past medical history is remarkable for  dementia.  She has labile hypertension.  She has had chronic UTIs in the  past.  She does have a history of a left bundle branch block which is  old.  She has had PSVT in the past and was put on beta-blocker in 2008.   CURRENT MEDICATIONS:  1. Diltiazem 120 mg a day.  2. Clonidine 0.05 mg daily.  3. Metoprolol 25 mg in the morning and 12.5 mg in the evening.  4. Namenda 10 mg b.i.d.  5. Aspirin 162 mg a day.  6. Cozaar 25 mg a day.  7. Prilosec.   ALLERGIES:  She is allergic to PENICILLIN  and intolerant to CODEINE,  LATEX, ACE INHIBITORS, ACE inhibitors cause cough.   SOCIAL HISTORY:  She does have dementia.  She lives at home with help.  She is a nonsmoker.   FAMILY HISTORY:  Is unremarkable.   REVIEW OF SYSTEMS:  She is hard of hearing.  The daughters suspect that  the patient may have sleep apnea.  She snores at night, sleep study was  discussed with Dr. Elsie Lincoln at the last office visit and because of the  patient's age and dementia and probable noncompliance with CPAP a study  was not pursued.   PHYSICAL EXAM:  VITAL SIGNS:  In the emergency room blood pressure  162/73, pulse is 52, temperature 97.4, respirations 12.  GENERAL:  She is a well-developed, well-nourished elderly female in no  acute distress.  HEENT:  Normocephalic, atraumatic.  Extraocular movements are intact.  Sclerae nonicteric.  Lids and conjunctivae within normal limits.  NECK:  Without JVD.  Thyroid is not enlarged.  CHEST:  Clear to auscultation and percussion.  CARDIAC:  Reveals regular rate and rhythm without murmur, rub or gallop.  Normal S1-S2.  ABDOMEN:  Nontender.  No hepatosplenomegaly.  EXTREMITIES:  Without edema.  Distal pulses are intact.  There are no  femoral bruits noted.  SKIN:  Warm and dry.  NEUROLOGICAL:  Grossly intact.  She is awake, alert, oriented,  cooperative in the emergency room.   EKG reveals sinus bradycardia with left bundle branch block.   LABS:  Sodium 137, potassium 4.5, BUN 24, creatinine 1.14.  White count  7.7, hemoglobin 13.3, hematocrit 39, troponins negative x1.  Chest x-ray  shows no active disease.   IMPRESSION:  1. Syncopal spell of unclear etiology, possibly secondary to sinus      bradycardia or arrhythmia.  2. History of labile hypertension.  3. History of PSVT (paroxysmal supraventricular tachycardia).  4. Dementia.  5. Mild coronary disease at catheterization 2003 with normal LV      function by echo in 2007.  6. Hard of hearing.    PLAN:  The patient was seen by Dr. Allyson Sabal and myself in the emergency  room.  We will go ahead and admit her for observation.  She may need an  event monitor as an outpatient.  We will go ahead and cut her beta-  blocker back to 12.5 mg b.i.d.      Abelino Derrick, P.A.      Nanetta Batty, M.D.  Electronically Signed    LKK/MEDQ  D:  11/05/2007  T:  11/05/2007  Job:  [id number]

## 2010-05-23 NOTE — Discharge Summary (Signed)
[name], [name]             ACCOUNT NO.:  [id number]   MEDICAL RECORD NO.:  [id number]          PATIENT TYPE:  INP   LOCATION:  2036                         FACILITY:  [facility]   PHYSICIAN:  Nanetta Batty, M.D.   DATE OF BIRTH:  12-Dec-1918   DATE OF ADMISSION:  11/05/2007  DATE OF DISCHARGE:  11/06/2007                               DISCHARGE SUMMARY   HISTORY:  This is an 77 year old female patient admitted by Dr. Allyson Sabal on  November 05, 2007 after having syncope at home.  She was sitting in her  chair.  Her caregiver had walked out, was gone may be 30 seconds walked  back in and she was sitting in the chair unresponsive.  She was not  diaphoretic.  EMS was called.  When EMS arrived, she was arousable.  She  denied chest pain, any palpitations, or increased heart rate.  No  shortness of breath.  She had no loss of urinary continence or bowel  continence.   PAST MEDICAL HISTORY:  Remarkable for dementia.  She also has labile  hypertension, chronic UTIs, history of left bundle-branch block which is  old.  She has had PSVT in the past and was placed on beta-blockers in  2008.  She also has a history of nonobstructive coronary disease in  2003.  Her last echo was in November 2007 revealing good LV function.  She has frequent or yearly episodes of syncope and the last was in  August 2008.  The one in August seems to be similar to the one that  happened on November 05, 2007.  Unable to have an etiology for her  syncopal episodes, thought to be orthostatic frequently and with her  hypertension, her blood pressure certainly dropped more than 50 systolic  points with her standing.  The patient was admitted and monitor.  Cardiac enzymes were negative.  By November 06, 2007, she was  significantly hypertensive with a blood pressure of 202/86.  She was  given her blood pressure meds early in the morning and by 1 o'clock she  was 158/78.  She will eat lunch, ambulates more, and recheck her  blood  pressure.  If it is less than 165 systolic, we will allow her to be  discharged.   She will follow up with Dr. Elsie Lincoln, unknown reason for etiology.   DISCHARGE MEDICATIONS:  1. Diltiazem 120 mg daily.  2. Clonidine 0.05 mg daily, very small dose.  3. Metoprolol 25 mg in the morning.  4. Metoprolol 12.5 mg in the evening.  5. Namenda 10 mg twice a day.  6. Aspirin 162 mg daily.  7. Cozaar 25 mg daily.  8. Prilosec 20 mg daily.  9. Multivitamin daily.  10.Fortical nasal spray 1 inhalation daily.   DISCHARGE INSTRUCTIONS:  1. Low-sodium heart-healthy diet.  2. No restrictions on activity.  She does not drive.  3. Follow up with Dr. Elsie Lincoln, November 20, 2007 at 12:15 p.m.   LABORATORY DATA:  Initial basic metabolic panel:  Sodium 137, potassium  4.5, chloride 102, CO2 28, glucose 110, BUN 24, and creatinine 1.14.  CBC:  Hemoglobin  13, hematocrit 39, WBC 7.7, platelet clumps on smear  appears adequate, neutrophils 72, and lymphs 18.  UA was hazy in  appearance but negative.  Protime 12.8, INR of 1, and PTT 27.  Comprehensive metabolic panel:  Glucose was slightly elevated at 150 but  this was not fasting, creatinine 0.11, and potassium 4.  LFTs were  within normal limits.   CK 78, MB 2.6, and troponin I less than 0.01.  Followup CK 63, MB 2.5,  troponin I less than 0.01, and last CK was 56, MB 2.4, and troponin I  0.02.   All negative for MI.  The patient's TSH was 1.342, again was normal.  Chest x-ray:  No acute findings.   HOSPITAL COURSE:  As stated.  She will follow up with Dr. Elsie Lincoln.      Darcella Gasman. Annie Paras, N.P.      Nanetta Batty, M.D.  Electronically Signed    LRI/MEDQ  D:  11/06/2007  T:  11/07/2007  Job:  [id number]   cc:   Burnell Blanks, MD

## 2010-05-23 NOTE — H&P (Signed)
NAMEELAYNE, [name]             ACCOUNT NO.:  [id number]   MEDICAL RECORD NO.:  [id number]          PATIENT TYPE:  EMS   LOCATION:  MAJO                         FACILITY:  [facility]   PHYSICIAN:  Cristy Hilts. Jacinto Halim, MD       DATE OF BIRTH:  1918-01-25   DATE OF ADMISSION:  05/08/2008  DATE OF DISCHARGE:                              HISTORY & PHYSICAL   CHIEF COMPLAINT:  Syncope.   HISTORY OF PRESENT ILLNESS:  An 77 year old white female who lives alone  was brought into [facility] by EMS secondary to syncope today,  May 08, 2008.  Ms. Mcdougald has multiple episodes of syncope at times  related to orthostatic hypotension.  Her last syncopal episode was  related to A fib with rapid ventricular response, which was April 04, 2008.  During that hospitalization a loop recorder was implanted so that  we could further evaluate heart rate in case of any further syncope.  In  the past she has had event monitors that have not revealed any severe  arrhythmias.   Today the patient was in her usual state of health.  She may have had  some epigastric discomfort.  She sat down at her kitchen table and  became unresponsive.  She had 2 episodes of emesis while unresponsive.  EMS was called.  On their arrival, her heart rate was 36, blood pressure  was 80/palpable.  She was given a half an amp of atropine.  Her heart  rate came up to 50, dropped back down into the 40s.  During this time  they stated second degree heart block and sinus bradycardia.  They also  gave her some Zofran in the field.  Blood pressure prior to transport  was 102/62.  The patient remembers sitting at the table and waking up in  the ambulance.  Denies any current chest pain and no complaints at all.  EMS reported on their arrival she was pale and diaphoretic.   PAST MEDICAL HISTORY:  Last heart cath in 2003 with nonobstructive  coronary disease.  Her last echo was April 05, 2008.  EF was 45-55%,  estimated to be 50%.  LV wall  thickness was mildly increased.  Trivial  pulmonic regurgitation.  Aortic valve thickness was mildly increased.  Mild aortic valve regurgitation.  Mild mitral valve regurgitation.   Other history includes dementia, hypertension, orthostatic hypotension,  paroxysmal A fib, chronic left bundle branch block, and chronic UTIs.   ALLERGIES:  PENICILLIN, CODEINE, ACE INHIBITORS AND ADHESIVE.   FAMILY HISTORY:  Is not significant to this admission.   SOCIAL HISTORY:  Widowed with 2 children, 4 grandchildren, 2 great-  grandchildren.  She does not use tobacco, or alcohol but she does live  alone.  No family is with her pretty much all the time.   REVIEW OF SYSTEMS:  GENERAL:  No colds or fevers.  SKIN:  Currently no  bruising. HEENT:  No blurred vision.  CARDIOVASCULAR:  She currently  denies chest pain.  PULMONARY:  No shortness of breath.  GI:  No diarrhea, constipation or melena.  GU:  Frequent UTIs.  No  complaints currently of UTI.  No hematuria.  NEURO:  No headaches.  This  is her only episode of syncope since her March.  MUSCULOSKELETAL:  No  significant problems.  ENDOCRINE:  No diabetes or thyroid disease.   Please note she is also had a history of PSVT in the past as well as  PAF.   CURRENT MEDICATIONS:  Amlodipine 5 mg every morning, amiodarone 200 mg  daily, metoprolol tartrate 25 mg in the morning and 12.5 mg in the  evening, Namenda 10 mg twice a day, aspirin 81 mg 2 daily, multivitamin  daily, Tylenol Arthritis 1 twice a day, saline nasal spray 1 daily,  trazodone p.r.n., muscle rub p.r.n., Cozaar 25 mg daily, Prilosec 10 mg  daily.   VITAL SIGNS: Today blood pressure in the ER now 140/58, respiratory 18,  pulse 46, temperature 97.7.  She has an external pacer in place.  GENERAL:  Alert, oriented x3, pleasant affect.  HEENT:  Normocephalic.  Sclerae clear.  NECK:  Supple.  No JVD, no bruits.  LUNGS:  Decreased breath sounds in the bases, otherwise clear.  HEART:  S1,  S2, regular rate and rhythm with a soft systolic murmur.  ABDOMEN:  Soft, nontender.  Positive bowel sounds.  Do not palpate  liver, spleen or masses.  LOWER EXTREMITIES:  With trace edema and positive varicosities.  SKIN:  No bruising noted currently.  NEURO:  Alert and oriented.  Follows commands.   LABORATORY VALUES:  Hemoglobin 12.4, hematocrit 35.8, platelets 192, WBC  7.3.  Protime 12.8.  INR 1, PTT 26.   Sodium 138, potassium 5.2, BUN 27, creatinine 1.48, glucose 103, calcium  9.1.  Cardiac markers:  CK-MB 63 with 3.4 MBs.  Magnesium level 2.8,  troponin I was 0.01.  Chest x-ray:  No acute chest findings.  EKG:  Sinus brady, a left bundle branch block without acute changes.   IMPRESSION:  1. Syncope, symptomatic, with a heart rate at 33, and hypotension.  2. Sick sinus syndrome with paroxysmal atrial fibrillation with a      rapid ventricular response and now treated with amiodarone and      metoprolol, severe bradycardia.  3. History of orthostatic hypotension resulting in syncope.  4. Chronic urinary tract infections.  5. Dementia.   PLAN:  Please note the patient is a DNR at home here due to her  arrhythmia.  We will certainly not intubate the patient, but if she does  have a slowing heart rate, will use external pacemaker and would  certainly go to a temporary pacemaker as needed as well as medications  to maintain her blood pressure, heart rate and so we can get a  pacemaker in.  The patient's family is agreeable to this.  Her daughter  is the power of attorney.  The plan will be a permanent pacemaker on  Monday.   Please note, loop recorder was interrogated.  Heart rate was down to 33  at the time she had her syncopal episode.      [name], N.P.      Cristy Hilts. Jacinto Halim, MD  Electronically Signed    LRI/MEDQ  D:  05/08/2008  T:  05/08/2008  Job:  [id number]   cc:   Cristy Hilts. Jacinto Halim, MD  Madaline Savage, M.D.  Burnell Blanks, MD

## 2010-05-26 NOTE — Discharge Summary (Signed)
[name], [name]             ACCOUNT NO.:  [id number]   MEDICAL RECORD NO.:  [id number]          PATIENT TYPE:  INP   LOCATION:  5021                         FACILITY:  [facility]   PHYSICIAN:  Isidor Holts, M.D.  DATE OF BIRTH:  1918/10/16   DATE OF ADMISSION:  02/19/2006  DATE OF DISCHARGE:                               DISCHARGE SUMMARY   DATE OF DISCHARGE:  To be determined.   PRIMARY CARE PHYSICIAN:  Unassigned.   PRIMARY CARDIOLOGIST:  Darlin Priestly, MD   DISCHARGE DIAGNOSES:  1. Syncopal episodes, secondary to dehydration, orthostasis, and      superadded effects of antihypertensive medications.  2. Labile hypertension.  3. Urinary tract infection.  4. Mild dementia.  5. Degenerative joint disease.   DISCHARGE MEDICATIONS:  1. Enteric-coated Aspirin 325 mg p.o. daily.  2. Clonidine 0.1 mg p.o. b.i.d.  3. Namenda 10 mg p.o. nightly.  4. Lopressor 25 mg p.o. b.i.d.  5. Cardizem CD 120 mg p.o. daily.  6. Ciprofloxacin 500 mg p.o. b.i.d. to be completed on February 26, 2006.  7. Colace 100 mg p.o. b.i.d.   PROCEDURES:  1. X-ray left arm and hand dated February 19, 2006.  This showed      remote-appearing distal radius fracture, first carpo-metacarpal      osteoarthritis, no acute findings.  2. Chest x-ray dated February 19, 2006.  This showed no acute finding      with cardiomegaly and old bilateral rib fractures.  3. Head CT scan and maxillofacial CT scan dated February 19, 2006.      This showed no acute intracranial abnormalities with extensive      chronic microvascular ischemic changes in atrophy noted.  Negative      for fracture or other acute abnormality with severe cervical      degenerative disease again noted.   CONSULTATIONS:  None.   HISTORY:  As in H&P notes of February 19, 2006, dictated by Jamison Oka, M.D.  However in brief, this is an 77 year old female, with  known history of labile hypertension, previous orthostatic  hypotension,  right posterior cerebral artery stenosis, minor coronary artery disease,  status post cardiac catheterization 2003 and Cardiolite stress test  2006, previous paroxysmal supraventricular tachycardia, degenerative  joint disease, and mild dementia, who presents following syncopal  episode.  For full details of circumstances, refer to excellent H&P  notes of February 19, 2006, dictated by Dr. Jamison Oka.  She was  admitted for further evaluation, investigation, and management.   HOSPITAL COURSE:  #1.  SYNCOPAL EPISODE:  The patient sustained blunt facial trauma during  the course of her fall, also injured left hand.  Head CT scan showed no  evidence of acute intracranial pathology, and facio-maxillary CT scan  showed no evidence of bony injury.  Left hand and forearm x-rays showed  old distal radial fracture, but no evidence of acute abnormality.  Initial laboratory evaluation demonstrated BUN of 16 with creatinine of  1.2.  Urinalysis showed positive urinary sediment consistent with  urinary tract infection.  Initial blood pressure was 191/93  with  significant orthostatic changes.  It was felt that patient's syncopal  episode was multifactorial in nature and likely secondary to a  combination of dehydration and orthostasis against the background of  urinary tract infection, and super added effect of continued  antihypertensive drug treatment.  She was managed with intravenous  infusion of normal saline, resulting in satisfactory hydration status.  Antihypertensive medications were initially held and then reintroduced  gingerly. By day #3 of hospitalization, we no longer recorded  significant orthostasis, and were able to discontinue intravenous  fluids, with no deleterious effects.  She was able to ambulate, was  evaluated by PT and OT who determined that no further PT or OT input was  indicated.  The patient`s facial and forearm bruising, gradually faded,  and by  February 24, 2006, the patient was in no discomfort whatsoever.   #2.  URINARY TRACT INFECTION:  For details of presentation, refer to #1  above.  Urinalysis confirmed positive urinary sediment, and urine  culture demonstrated over 100,000 colonies of multiple bacterial  morphotypes.  Be that as it may, as patient apparently has had recurrent  UTIs in the past. She was managed with a 7-day course of Ciprofloxacin,  to be completed on February 26, 2006.  Repeat urinalysis of February 22, 2006, was negative.   #3.  LABILE HYPERTENSION:  The patient has a history of labile  hypertension.  During the course of her hospitalization, her blood  pressure tended to remain under high; i.e., suboptimal, even against a  background of significant orthostatic hypotension during the initial  days of hospitalization.  Against this background, it was felt that  patient was better off with a mildly elevated blood pressure, to avoid  recurrences of hypotension which were medication induced and subsequent  syncopal episodes.  She has, however, been recommended bilateral above-  knee TED stockings which she is utilizing to good effect.   #4.  MILD DEMENTIA:  The patient's mentation is at baseline.  She  continues on her pre-admission Namenda.   DISPOSITION:  The patient was considered sufficiently clinically  recovered  and stable on February 24, 2006, to be discharged.  However,  patient`s family desire that patient spend some time in a short-term SNF  to rehabilitate, before returning to previous abode.  Pre-admission, the  patient lived independently and was fully functional.  She is very  resistant to the idea of an assisted living facility.  Provided adequate  arrangements can be put in place, it is anticipated that patient will be  discharged in the next day or two.      Isidor Holts, M.D.  Electronically Signed    CO/MEDQ  D:  02/24/2006  T:  02/24/2006  Job:  [id number]   cc:   Darlin Priestly, MD

## 2010-05-26 NOTE — Discharge Summary (Signed)
NAME:  [name], [name]                       ACCOUNT NO.:  [id number]   MEDICAL RECORD NO.:  [id number]                   PATIENT TYPE:  INP   LOCATION:  1825                                 FACILITY:  [facility]   PHYSICIAN:  Darlin Priestly, M.D.             DATE OF BIRTH:  [birth year]   DATE OF ADMISSION:  08/08/2001  DATE OF DISCHARGE:  08/08/2001                                 DISCHARGE SUMMARY   ADMISSION DIAGNOSES:  1. Unstable angina.  2. History of paroxysmal atrial fibrillation.  3. History of negative Cardiolite March 2001.  4. Hypertension.  5. Hyponatremia.  6. Neuralgia of the lower extremity.   DISCHARGE DIAGNOSES:  1. Chest pain, questionable etiology.  2. History of paroxysmal atrial fibrillation.  3. History of negative Cardiolite March 2001.  4. Hypertension.  5. Hyponatremia.  6. Neuralgia of the lower extremity.  7. Status post cardiac catheterization August 08, 2001, by Dr. Lenise Herald     revealing no significant CAD.  EF 50%.   HISTORY OF PRESENT ILLNESS:  [name] is an 77 year old white female with  a history of atrial fibrillation in 2000.  She had no prior cardiac  catheterization but did have a negative stress test, negative Cardiolite in  March 2001.  She had been in her usual state of health until she was awoken  on the morning of admission after a bad dream.  She then walked to the front  of the house and both arms began tingling and she developed chest pressure  midsternally.  She felt clammy and short of breath.  She called 911 and EMS  gave her 3 sprays of nitroglycerin with some relief and at the time of our  interview her pain was 1/10.  As well at home her blood pressure has been  elevated at about 210 systolically and heart rate was elevated about 101.  At the time of our exam her blood pressure was 150/95, heart rate 88, oxygen  sat 95% on room air.  No significant findings on physical exam.  Initial  cardiac enzymes were  negative.  EKG shows sinus rhythm, left bundle branch  block, no acute change.   At that time she was seen and evaluated by Dr. Lenise Herald.  At that time  he discussed options of further evaluation including a Cardiolite scan  versus cardiac catheterization.  The patient requested undergoing a  definitive cardiac catheterization on that day if possible.  Risks and  benefits of the procedure were discussed and she was willing or proceed.  Therefore, she was continued on her IV heparin, IV nitroglycerin,  __________ was sent for cardiac catheterization.   HOSPITAL COURSE:  On August 08, 2001 [name] underwent cardiac  catheterization by Dr. Lenise Herald.  She had some 30% stenosis of the  proximal LAD as well as 30% stenosis in the ostial diagonal 1.  No other  CAD.  EF  50% with apical hypokinesis.  No renal artery stenosis.  She  tolerated the procedure well and had no complications.   She was later discharged home after her bedrest was complete.  Of note, Dr.  Jenne Campus did use the Perclose device to shorten her amount of bedrest.  She  had no post procedure complications and was discharged home at the end of  her bedrest.   HOSPITAL CONSULTS:  None.   HOSPITAL PROCEDURES:  1. Cardiac catheterization on August 08, 2001 by Dr. Lenise Herald.  She was     found to have no significant CAD.  The LAD had a proximal 30% stenosis.     The first diagonal had a 30% ostial lesion.  No other significant disease     throughout.  EF was 50% with mild apical hypokinesis.  There was no renal     aortic stenosis.  Abdominal aortogram showed no stenosis.  She tolerated     the procedure well and had no complications.  He used successful Perclose     of the right femoral site.   LABS:  Cardiac enzymes are negative with CK 101, MB 2.9, troponin 0.01, PT  12.8, INR 0.9, PTT 28.  White count 5.9, hemoglobin 13.9, hematocrit 41.1,  platelet 194.  Sodium 133, potassium 4.5, BUN 15, creatinine 1.2,  glucose  97.   EKG shows normal sinus rhythm left bundle branch block.  No ST-T change.   Chest x-ray showed no infiltrate, no acute disease.   DISCHARGE MEDICATIONS:  She was to continue her previous outpatient home  medications.  As well she was given a prescription for proton pump inhibitor  in the event that her symptoms were GI related.   She was instructed no strenuous activity, lifting over 35 pounds, driving or  sexual activity for 3 days.   Call [phone] if any bleeding or __________ pain to groin site.   She is scheduled to see Dr. Jenne Campus back Thursday, August 28th at 12:00 p.m.     [name], P.A.-C.                   Darlin Priestly, M.D.    MBE/MEDQ  D:  08/20/2001  T:  08/24/2001  Job:  [id number]   cc:   Darlin Priestly, M.D.

## 2010-05-26 NOTE — Cardiovascular Report (Signed)
NAME:  [name], [name]                       ACCOUNT NO.:  [id number]   MEDICAL RECORD NO.:  [id number]                   PATIENT TYPE:  INP   LOCATION:  1825                                 FACILITY:  [facility]   PHYSICIAN:  Darlin Priestly, M.D.             DATE OF BIRTH:  [birth year]   DATE OF PROCEDURE:  08/08/2001  DATE OF DISCHARGE:                              CARDIAC CATHETERIZATION   PROCEDURES:  1. Left heart catheterization.  2. Coronary angiography.  3. Left ventriculogram.  4. Abdominal aortogram.   COMPLICATIONS:  None.   INDICATIONS:  The patient is an 76 year old, female patient of Dr. Jenne Campus  and Dr. Nathanial Rancher with a history of hypertension, a history of left bundle  branch block, and paroxysmal atrial fibrillation, who presented to the ER on  August 08, 2001, with a complaint of substernal chest pressure.  The patient  does have a history of remote Cardiolite scan in 2001 revealing no evidence  of ischemia.  She does have known left bundle and is now referred for  cardiac catheterization to definitively define her coronary anatomy.   DESCRIPTION OF PROCEDURE:  After giving informed consent, the patient was  brought to the cardiac catheterization laboratory.  The right groin was  shaved, prepped, and draped in the usual sterile fashion.  ECG monitoring  was established.  Using the modified Seldinger technique, a 6 French  arterial sheath was inserted in the right femoral artery.  A 6 French  diagnostic catheter was used to perform diagnostic angiography.  This  revealed a large left main with no significant disease.  The LAD was a large  vessel which coursed the apex and the first three diagonal branches.  The  LAD was noted to be calcified in its proximal portion with up to 30%  proximal stenosis.  The remainder of the LAD was irregular, but had no high-  grade lesion. The first diagonal was a small vessel with a 30% ostial  lesion.  The second diagonal was a  medium size vessel with no significant  disease.  The third diagonal was a small vessel with no significant disease.   The left circumflex was a large vessel which coursed the AV groove and  branch into two obtuse marginal branches.  The AV groove circumflex had no  significant disease.  The first and second OMs were large vessels which  bifurcated in the mid segment and had no significant disease.   The right coronary artery was a large vessel and was dominant with good size  PD, as well as posterolateral branch.  There was no significant disease in  the RCA, PD, or posterolateral branch.   The left ventriculogram revealed an EF of 50% with mild apical hypokinesis.   The abdominal aortogram revealed no evidence of stenosis.   Hemodynamics:  Systemic retrograde pressure 196/72, LV stent pressure  189/11, LVEDP 32.   After the conclusion of the  case, the right femoral site was then closed  using a Perclose device without complication.  One gram of vancomycin was  given prophylactically.    CONCLUSIONS:  1. No  significant coronary artery disease.  2. Normal _________  function with wall motion abnormalities noted above.  3. No evidence of renal artery stenosis.  4. Systemic hypertension.  5. Successful Perclose of the right femoral site with 1 g of vancomycin     given prophylactically.                                               Darlin Priestly, M.D.    RHM/MEDQ  D:  08/08/2001  T:  08/14/2001  Job:  [id number]   cc:   Dr. Nathanial Rancher

## 2010-05-26 NOTE — H&P (Signed)
NAME:  [name], [name]             ACCOUNT NO.:  [id number]   MEDICAL RECORD NO.:  [id number]          PATIENT TYPE:  INP   LOCATION:  1829                         FACILITY:  [facility]   PHYSICIAN:  Mobolaji B. Bakare, M.D.DATE OF BIRTH:  Mar 14, 1918   DATE OF ADMISSION:  02/19/2006  DATE OF DISCHARGE:                              HISTORY & PHYSICAL   Primary care physician:  Unassigned.  Cardiologist:  Dr. Jenne Campus.   CHIEF COMPLAINT:  Syncope.   HISTORY OF PRESENTING COMPLAINT:  [name] is an 77 year old  Caucasian female who resides independently at home.  She is quite  functional.  Yesterday afternoon the patient was walking into her  kitchen and the next thing she noticed was that she was on the floor  with a swollen face.  She had passed out.  She could not tell how long  she passed out.  She was brought to the hospital by a daughter.  The  patient had no prodromal symptoms prior to passing out.  She has had  similar syncopal episode in November 2007.  At that time it was felt to  be secondary to orthostatic hypotension.  She does have a history of  orthostatic hypertension with labile hypertension.  She had syncopal  workup which included MRI of the brain, which was unremarkable.  The MRA  showed tight focal stenosis of the posterior cerebral artery, which  necessitated a cerebral angiogram.  This confirmed tapered narrowing of  the right posterior cerebral artery with a focal tight stenosis in the  distal P1 segment.  A decision was made to treat this medically.  The  patient was started on aspirin.  A 2 D echocardiogram showed ejection  fraction of 70%.  EEG was normal.   REVIEW OF SYSTEMS:  The patient denies cough, chest pain, shortness of  breath, nausea, vomiting, diarrhea, no headaches.   PAST MEDICAL HISTORY:  1. Labile hypertension.  2. Orthostatic hypotension.  3. Posterior cerebral artery stenosis.  4. Minor coronary artery disease, status post catheterization  in 2003      and Cardiolite stress test in 2006.  5. Paroxysmal supraventricular tachycardia.  6. Aortic valve calcification by echocardiogram.  7. Degenerative joint disease.  8. Mild dementia.   MEDICATIONS:  1. Catapres 0.4 mg b.i.d.  2. Namenda 10 mg q.h.s.  3. Aspirin two baby aspirin daily.  4. Metoprolol 25 mg b.i.d.  5. Diltiazem 120 mg daily.   ALLERGIES:  CODEINE, LATEX, PENICILLIN.   FAMILY HISTORY:  Noncontributory.   SOCIAL HISTORY:  The patient lives independently and has been  functional.  She does not smoke cigarettes nor drink alcohol.   PHYSICAL EXAMINATION:  INITIAL VITAL SIGNS:  Blood pressure 191/93,  pulse of 70, respiratory rate of 16, O2 saturation of 94%.  Temperature  99.3.  GENERAL:  On examination, the patient is comfortable, not in respiratory  distress.  HEENT:  She has a bruise over the left side of her face, which is  swollen.  Pupils equal, round, and reactive to light.  No elevated JVD,  no carotid bruit.  Mucous membranes dry.  LUNGS:  Clear clinically to auscultation.  CARDIOVASCULAR:  S1, S2, regular, a short systolic murmur.  ABDOMEN:  Nondistended, soft, nontender.  Bowel sounds present.  No  palpable organomegaly.  EXTREMITIES:  No pedal edema or calf tenderness.  Dorsalis pedis pulses  +2 bilaterally.  Left hand is swollen with hematoma.  There is no  ischemia or cyanosis.  CENTRAL NERVOUS SYSTEM:  Motor:  Power 5/5 in all limbs.   INITIAL LABORATORY DATA:  Urinalysis:  Cloudy in appearance with  specific gravity 1.020, nitrite negative, large leukocyte.  Microscopy  shows white cell of 21-50, few bacteria.  PT 13.4/1.0.  Sodium 137,  potassium 3.5, chloride 104, glucose 96, BUN 16, bicarb 29, creatinine  1.2.  Cardiac markers normal.   RADIOLOGIC DATA:  Chest x-ray:  No active cardiopulmonary disease or  bilateral rib fractures noted.  X-ray of left forearm:  No acute  findings, old distal radial fracture noted.  X-ray of left  hand:  No  acute fracture, severe fourth carpometacarpal degenerative joint  disease.  Head CT scan showed no acute findings, severe C-spine  degenerative disease.  EKG:  Normal sinus rhythm with a heart rate of 64  and nonspecific ST abnormalities.   ORTHOSTATIC BLOOD PRESSURE:  Lying, 185/75 with a pulse of 88; sitting,  144/90 with a pulse of 82; standing, 143/87 with a pulse of 97.   ASSESSMENT AND PLAN:  1. Syncope secondary to orthostatic hypotension.  This is a recurrent      episode, although blood pressure is uncontrolled.  Will admit to      telemetry, IV fluids of half normal saline at 125 mL/hr.  Cycle      cardiac enzymes q.8h. x3.  I will not repeat echocardiogram or MRI      at this point.  Will check orthostatic blood pressure daily.  2. Urinary tract infection.  The patient is although asymptomatic, she      has significant pyuria in the urine.  Will start ciprofloxacin 200      mg IV q.12h. pending urine culture.  3. Facial bruise and left forearm bruise.  Will give hydrocodone 5 mg      p.o. q.4h. p.r.n. for pain.  4. History of paroxysmal supraventricular tachycardia.  This is      stable.  5. History of coronary artery disease.  6. Hypertension.  Blood pressure is still uncontrolled.  Will resume      home medications.  7. Mild dementia.  Resume Namenda.      Mobolaji B. Corky Downs, M.D.  Electronically Signed    MBB/MEDQ  D:  02/20/2006  T:  02/20/2006  Job:  [id number]   cc:   Darlin Priestly, MD

## 2010-05-26 NOTE — Discharge Summary (Signed)
NAMENYELAH, EMMERICH             ACCOUNT NO.:  [id number]   MEDICAL RECORD NO.:  [id number]          PATIENT TYPE:  INP   LOCATION:  2005                         FACILITY:  [facility]   PHYSICIAN:  Darlin Priestly, MD  DATE OF BIRTH:  April 27, 1918   DATE OF ADMISSION:  08/24/2006  DATE OF DISCHARGE:  08/29/2006                               DISCHARGE SUMMARY   DISCHARGE DIAGNOSES:  1. Syncope secondary to orthostatic hypertension, resolved with      medication adjustment.  2. Labile hypertension.  Will need less rigid control of pressure.  3. Normal left ventricular function.  4. History of nonobstructive coronary disease.  5. Dementia.  6. Urinary tract infection.  7. Positive H. pylori but no treatment.   DISCHARGE MEDICATIONS:  1. Prilosec 20 mg 1 daily over the counter.  2. Diltiazem 120 mg daily.  3. Clonidine 0.1 mg daily.  4. Metoprolol 25 mg 1/2 tablet at bedtime and whole tablet 25 mg in      the morning.  5. Namenda 10 mg twice a day.  6. Aspirin 81 mg 2 daily.  7. Cozaar 25 mg daily.  8. Tylenol Arthritis in the morning and in the evening.  9. __________  10.Multivitamin daily.  11.Cortical spray as before.  12.Trazodone 1/2 tablet at bedtime.  13.Cipro 250 mg 1 twice a day for 7 days.  Followup with Dr. Earlene Plater.  14.Stop taking Razadyne, glucosamine, Actonel, nitrofurantoin due to      creatinine clearance and Vesicare.   DISCHARGE INSTRUCTIONS:  1. Heart healthy diet.  2. Increase activity slowly.  3. Followup with Dr. Jenne Campus Wednesday, September 04, 2006 at 11:15 a.m.  4. Followup with Dr. Earlene Plater in 2 weeks.  5. Followup with primary care, Dr. Nathanial Rancher.   HISTORY OF PRESENT ILLNESS:  Ms. Devoss is an 77 year old female who  was admitted on August 24, 2006 after a syncopal episode.  She has a  history of multiple syncopal episodes.  She did complain of chest pain  when she presented to the ER as well.  The patient states she woke up on  the morning of admission,  August 24, 2006, in her usual state of health.  Around 8:30 a.m., after taking her medicine, she talked to her children  on the phone.  30-40 minutes later her son arrived to see her and found  the patient sitting in the chair with gurgling breath sounds.  He was  trying to wake her up.  The patient was unarousable.  He called EMS.  The patient was taken to emergency.   PAST MEDICAL HISTORY:  1. Multiple admissions for syncope presumed secondary to orthostatic      hypotension.  2. No significant coronary disease in 2003 with cath.  3. Normal Cardiolite in 2006.  EF of 71%.  4. Last 2D echo in 2006 had normal EF.  5. History of dementia, labile hypertension, peripheral vascular      disease with PCA stenosis.  6. PSVT.  7. History of joint disease.  8. Chronic UTIs.   FAMILY HISTORY, SOCIAL HISTORY AND REVIEW OF SYSTEMS:  See  H&P.   PHYSICAL EXAMINATION AT DISCHARGE:  VITAL SIGNS:  Blood pressure 139/68,  pulse 82, respiratory rate 20, oxygen saturation 94%.  A little febrile  at 99.  GENERAL:  An alert, oriented female.  SKIN:  Warm and dry.  LUNGS:  Clear.  HEART:  Regular rate and rhythm.   LABORATORY DATA:  Hemoglobin 13.4, hematocrit 39.3, WBC 10.12, platelets  45, neutrophils 84, lymphs 10, monos 66, eos 0, baso 0.  This remained  stable.  White count at discharge was 10.5., neutrophils 70, lymphs 18,  mono 9, eos 2.  PT was normal at 12.6, INR 0.9, PTT 26, D-Dimer was  0.36.   Chemistry:  Sodium 132, potassium 3.7, chloride 97, CO2 26, glucose 125,  BUN 17, creatinine 1.7.  These remained stable.  Glucose actually came  down to 102.  Potassium at one point was 3.3 and that was replaced.   Total protein 6.6, albumin 3.4, AST 20, ALT 18, alkaline of 5, 64 total  bili, 0.9 magnesium, 2.1 amylase, lipase 23.  Cardiac enzymes were all  negative 30, 27, 29.  MV is negative at 0.9.  Troponin of 0.02 x2.  TSH  3.674, UA had a small amount of leukocytes, epithelial few and  bacteria  few.  She had greater than 100,000 colonies of pseudomonas aeruginosa.  H. pylori was positive at 6.8.  I did discuss that with GI and he felt  as long as she was stable not to treat that as it was unclear if this  was old or new.   Chest x-ray no cardiomegaly.  No active lung disease on admission.  Acute abdominal series showed feces throughout colon.  No obstruction or  free air.   EKG sinus bradycardia with rate of 53.  Left axis deviation.  Left  bundle branch block.   HOSPITAL COURSE:  Ms. Fei was admitted on August 24, 2006 after  syncope at home.  Her son found her unresponsive.  She did have some  chest pain.  She was put on IV nitro.  She also had complaint of some  abdominal discomfort.  CT of the head was also done which showed stable,  advanced, chronic small vessel disease and volume loss.  No acute  intracranial abnormality.  The patient's cardiac enzymes were negative.  Nitroglycerin was discontinued.  She had no complaints of pain.  Medications were adjusted also because of slow heart rate with a bundle  branch block.  She had been on Macrodantin at home because of creatinine  clearance.  We discontinued her Macrodantin.  She was on Razadyne for  diuretic.  That was discontinued.  Told it had a component to her heart  failure.   The patient improved over the next several days.  Blood pressure was  controlled.  She was able to ambulate with cardiac rehab and did well.   Please note ACE inhibitors caused cough so we did not use ACE.  We did  put her back on a low dose of Cozaar.  At one time she had been on 100  mg in the past.  With one of her syncopal episodes it had been  discontinued.  So we just started her at a low dose.  Her metoprolol  needs to be monitored for her bradycardia, too.   The patient will followup with Dr. Jenne Campus and was seen and discharged  by Dr. Elsie Lincoln.      Darcella Gasman. Lelan Pons  H. Jenne Campus, MD   Electronically Signed    LRI/MEDQ  D:  10/03/2006  T:  10/04/2006  Job:  [id number]   cc:   Burnell Blanks, MD  Lucrezia Starch. Earlene Plater, M.D.

## 2010-05-26 NOTE — Consult Note (Signed)
Victoria, Carr             ACCOUNT NO.:  [id number]   MEDICAL RECORD NO.:  [id number]          PATIENT TYPE:  INP   LOCATION:  3715                         FACILITY:  [facility]   PHYSICIAN:  Bevelyn Buckles. [place], M.D.DATE OF BIRTH:  08/10/1918   DATE OF CONSULTATION:  DATE OF DISCHARGE:                                   CONSULTATION   REQUESTING PHYSICIAN:  Dr. Clarene Duke.   REASON FOR CONSULTATION:  Syncope.   HISTORY OF PRESENT ILLNESS:  [name] is an 77 year old Caucasian female  with multiple medical problems, who presents after a fall and syncope where  she had a positive loss of consciousness.  The patient does have a history  of orthostatic hypotension and syncope in the past.  Two days ago she went  to her kitchen, she had no warning where she blacked out, fainted, and lost  consciousness.  There is no evidence of shaking, convulsions, incontinence,  or tongue biting.  The patient did hit her head when she fell.  She was only  out for a few seconds.  When she came to, she felt slightly confused.  She  also has been complaining of some unsteadiness and disequilibrium on her  feet.  She does have a history of TIAs with some slurred speech.  She denies  any focal weakness, numbness, vision changes, speech changes, or vertigo.   PAST MEDICAL HISTORY:  Positive for:  1. Hypertension.  2. Orthostatic syncope.  3. Osteoarthritis.  4. Mild dementia.  5. CAD.   MEDICATIONS:  Include Cozaar, aspirin, Catapres, Namenda, and Cardizem.   ALLERGIES:  THE PATIENT HAS ALLERGIES TO PENICILLIN AND CODEINE.   FAMILY HISTORY:  Positive for hypertension, heart disease, and diabetes.   SOCIAL HISTORY:  The patient lives alone.  Denies any tobacco or alcohol  use.   REVIEW OF SYSTEMS:  Positive as per HPI and also positive for occasional  shortness of breath.  Review of systems is negative as per HPI in greater  than 7 other organ systems.   EXAMINATION:  VITALS:  Temperature is  97.9, pulse is 93, respirations 18,  blood pressure is 164/91, O2 saturation is 97% on room air.  HEENT:  Normocephalic, atraumatic.  Extraocular muscles are intact.  NECK:  Supple, no carotid bruits.  HEART:  Regular.  LUNGS:  Clear.  ABDOMEN:  Soft, nontender.  EXTREMITIES:  Show pulses.  NEUROLOGICAL:  The patient is awake, alert, following commands  appropriately.  Cranial nerves II-XII are grossly intact.  The patient has  slight decreased hearing on the right.  Motor examination shows 4+/5  strength and normal tone and no drift noted.  Sensory examination is within  normal limits to light touch.  Reflexes 1-2+.  Tongue symmetric.  Toes are  neutral bilaterally.  Cerebellar function is within normal limits finger-to-  nose.  Gait was not assessed secondary to safety.   MRI of the brain was reviewed to have no acute ischemia, small vessel  disease, possible focal stenosis on the right P1 segment.  A 2D  echocardiogram showed an EF of 60%.  UA showed trace leukocyte  esterase and  WBCs 3-6.   LABS:  WBC is 11.8, hemoglobin 13.1, hematocrit is 37.7.  Platelets 216.  PT  13.3, INR is 1.0, PTT is 28.  D. dimer is 1.8.  Sodium is 134, potassium is  4.0, chloride is 197.  CO2 is 29, BUN 20, creatinine is 1.3, glucose 118,  TSH is 1.805, magnesium is 2.1, calcium is 9.3, LFTs are within normal  limits.   IMPRESSION:  This is an 77 year old with syncope and known orthostatic  hypotension.  Orthostasis could be easily contributing to her symptoms.  I  agree with getting an MRI/MRA of the brain, which was reviewed today.  I  would also recommend getting an EEG to rule out the possibility of seizures.  The patient does have a positive UA and I will treat her with antibiotics  for 5 days, Levaquin 500 mg per day.  This also might be contributing.  I  would recommend obtaining cardiac enzymes with patient's history and also  continue her aspirin a day, given her findings on MRI scan.  I will  continue  supportive care and treatment and will follow the patient while she is in  the hospital.      Bevelyn Buckles. Nash Shearer, M.D.  Electronically Signed     DRC/MEDQ  D:  11/14/2005  T:  11/15/2005  Job:  [id number]

## 2010-05-26 NOTE — Discharge Summary (Signed)
[name], [name]             ACCOUNT NO.:  [id number]   MEDICAL RECORD NO.:  [id number]          PATIENT TYPE:  INP   LOCATION:  4732                         FACILITY:  [facility]   PHYSICIAN:  Nanetta Batty, M.D.   DATE OF BIRTH:  [birth year]   DATE OF ADMISSION:  04/05/2004  DATE OF DISCHARGE:  04/06/2004                                 DISCHARGE SUMMARY   DISCHARGE DIAGNOSES:  1.  Syncope secondary to orthostatic hypotension.  2.  Chest pain with negative myocardial infarction.  3.  Mild coronary disease with only 30% left anterior descending and      diagonal #1 stenosis in 2003 with ejection fraction of 50%.  4.  History of paroxysmal atrial fibrillation.  Only sinus rhythm documented      in the hospital.   DISCHARGE CONDITION:  Improved.   PROCEDURES:  None.   DISCHARGE MEDICATIONS:  1.  Elavil 50 mg every evening.  2.  Actonel 5 mg one daily.  3.  Clonidine 0.1 mg twice a day.  4.  Namenda 10 mg twice a day.  5.  Cardizem 360 one every morning.  6.  Cozaar 100 mg daily.   DISCHARGE INSTRUCTIONS:  Activity without restrictions except to increase  activity slowly.  May shower and bathe.  Follow up with Dr. Lenise Herald  May 04, 2004 at 11:30 a.m.   You are scheduled for a 2-D echo April 27, 2004 at 11 a.m. and a Persantine-  Cardiolite April 27, 2004 at 8:30 a.m.  Do not eat or drink after midnight  the night before, no caffeine and milk products the day before, no perfume  and wear two-piece clothing.   HISTORY OF PRESENT ILLNESS:  [age]-year-old patient of Dr. Lenise Herald was seen in the emergency room April 05, 2004 after she presented  after going to the bathroom and voided, she then found herself on the floor  between the bathroom and the hall.  She eventually called her daughter who  called 911.  Patient had no real complaints until police and ENT came to the  door, she got up and opened the door, she complained of tingling in her left  arm  and hand and then chest felt abnormal.  Patient was seen by EMT and  transferred to [facility] ER for evaluation.   She states her chest pain symptoms were not really pain similar to how she  felt with her atrial fibrillation.  No nausea, shortness of breath or  diaphoresis.  No palpitations.  When she was ready for transport, she did  have mild nausea and tingling in her arm.   PAST MEDICAL HISTORY:  Syncope related to hypertension, hypertension since  2000, osteoporosis, chest pain with cath in August 2003 with only 30% LAD  stenosis, 30% diagonal #1 and 50% EF, history of paroxysmal AF, mild aortic  sclerosis, mild MR.  She also has mild dementia and decreased short-term  memory.   ALLERGIES:  AMOXICILLIN causes her to feel funny.   MEDICATIONS:  Outpatient meds are same as inpatient.   SOCIAL HISTORY, FAMILY HISTORY, REVIEW OF SYSTEMS:  See H&P.   PHYSICAL EXAMINATION AT DISCHARGE:  Blood pressure 112/53, pulse 63,  respirations 16, temperature 97.6, oxygen saturation remained 91%.  Orthostatic's lying down 112/53, sitting 110/63, standing 70/57.  She was  given IV fluids 200/hr for 3 hours and then her orthostatic's were stable  though I do not have documentation of the numbers in the chart currently.  Heart sounds S1-S2 regular rate and rhythm with a normal S1-S2 with a 2/6  systolic murmur.  Lungs clear to auscultation bilaterally and bibasilar.  Abdomen soft, nontender, positive bowel sounds.  Lower extremities without  edema.   LABORATORY DATA:  Hemoglobin 12.1, hematocrit 35.5, WBC 6.6, MCV 88.5,  platelets 199, neutrophils 72, lymphs 19, mono 7, eos 1, baso 0.  Chemistry:  Sodium 134, potassium 4.1, chloride 101, CO2 27, glucose 97, BUN 17,  creatinine 1.  These remained stable.  Glucose was slightly up but she was  getting D-5 and half.  AST 27, ALT 21, ALP 71, total bili 0.7, magnesium  2.2.   Cardiac enzymes were negative.  CK's 90-84, MB's 2.0 to 1.9, troponin 0.01  to  0.02.   Cholesterol 190, triglycerides 83, HDL 74, LDL 99, TSH 1.771.  UA was  negative except for small amount of leukocyte esterase.   Chest x-ray stable cardiomegaly and COPD.  No acute findings.  CT of the  head without contrast no acute intracranial abnormality.   __________ sinus rhythm, left axis deviation and left bundle branch block  and there were no changes.   HOSPITAL COURSE:  [name] was admitted April 05, 2004 by Dr. Allyson Sabal after  a syncopal episode.  She was found to be orthostatic.  She was given IV  fluids and blood pressures normalized.  By the morning of April 06, 2004 she  was ambulating in the hall without problem, was ready for discharge, was  seen and evaluated by Dr. Jenne Campus.      LRI/MEDQ  D:  05/22/2004  T:  05/22/2004  Job:  [id number]   cc:   Darlin Priestly, MD  [address], [address]  [place]  [place] [address]  Fax: [phone]   Maura L. Hamrick, M.D.  [place], [place]

## 2010-05-26 NOTE — Procedures (Signed)
EEG NUMBER:  [id number].   CLINICAL HISTORY:  The patient is an 77 year old with a syncopal episode,  resulting in falling and hitting the back of her head.  CT showed no acute  findings.   Medications include Cozaar, aspirin, Ocuvite, Namenda, Cardizem, Percocet,  Tylenol, and Benadryl.   The international 10-20 system lead placement was used.   DESCRIPTION OF FINDINGS:  Dominant frequency is at 10 Hz, 20-30 mV of  activity.  This was well regulated.  Background activity is a mixture of  alpha and beta range components.  The patient quickly drifts into natural  sleep with vertex sharp waves symmetric and synchronous sleep spindles and a  desynchronized background of delta range activity.  Photic stimulation  induced a driving response at 1, 3, 5, 7, 9, 13, and 17 Hz.   EKG showed a regular sinus rhythm with ventricular response of 96 beats per  minute.   IMPRESSION:  Normal electroencephalogram in the waking state and in natural  sleep.      Deanna Artis. Sharene Skeans, M.D.  Electronically Signed     EAV:[id number]  D:  11/15/2005 11:56:19  T:  11/15/2005 19:14:19  Job #:  [id number]   cc:   Bevelyn Buckles. Nash Shearer, M.D.  Fax: [phone]

## 2010-05-26 NOTE — Procedures (Signed)
[place]  Patient:    [name], [name]               MRN: [id number] Proc. Date: 03/26/00 Adm. Date:  [id number] Attending:  Orland Mustard CC:         Burnell Blanks, M.D., [facility]   Procedure Report  PROCEDURE:  Colonoscopy.  MEDICATIONS:  Fentanyl 60 mcg, Versed 6 mg IV.  ENDOSCOPE:  Pediatric colonoscope.  INDICATION:  A strong family history of colon cancer.  This is done as a screening colonoscopy.  DESCRIPTION OF PROCEDURE:  The procedure had been explained to the patient and consent obtained.  With the patient in the left lateral decubitus position, the Olympus video colonoscope was inserted and advanced under direct visualization.  The prep was excellent.  We were able to advance to the cecum using abdominal pressure and position changes.  The cecum was identified by identification of the ileocecal valve and appendiceal orifice.  The scope was withdrawn, and the cecum, ascending colon, hepatic flexure, transverse colon, splenic flexure, descending, and sigmoid colon were seen well.  A few scattered diverticula in the sigmoid colon.  No evidence of polyps throughout. A few internal hemorrhoids.  The scope was withdrawn.  The patient tolerated the procedure well.  ASSESSMENT: 1. Scattered diverticula. 2. Internal hemorrhoids. 3. No colon polyps.  PLAN:  Due to the patients age, would not recommend a five-year screening colonoscopy.  Would screen for any specific indications such as anemia, heme-positive stools, etc. DD:  03/26/00 TD:  03/26/00 Job: [id number]

## 2010-05-26 NOTE — Discharge Summary (Signed)
[name], [name]             ACCOUNT NO.:  [id number]   MEDICAL RECORD NO.:  [id number]          PATIENT TYPE:  INP   LOCATION:  3715                         FACILITY:  [facility]   PHYSICIAN:  Darlin Priestly, MD  DATE OF BIRTH:  11-Apr-1918   DATE OF ADMISSION:  11/12/2005  DATE OF DISCHARGE:                               DISCHARGE SUMMARY   DISCHARGE DIAGNOSIS:  1. Syncope, presumed secondary to orthostatic hypotension.  2. Labile hypertension.  3. Peripheral vascular disease with posterior cerebral artery stenosis      by angiogram this admission, plan is for medical therapy only.  4. Minor coronary disease at catheterization in 2003.  5. Paroxysmal supra-ventricular tachycardia, stable at discharge.  6. History of normal left ventricular function with aortic valve      calcification, no aortic stenosis by echo.  7. Mild dementia.  8. Degenerative joint disease.   HOSPITAL COURSE:  The patient is a pleasant 77 year old female followed  by Dr. Jenne Campus and Dr. Barbee Shropshire.  She has a history as noted above.  She  had minor coronary disease in 2003.  She had a Cardiolite in April 2006  that showed small focal defect apically with an EF of 71%.  She was seen  in the office November 12, 2005.  She had had an episode of syncope.  She  had gone to the bathroom, went to the kitchen and then apparently  collapsed.  There was no obvious seizure activity and no spontaneous  defecation or urination.  There was no post syncopal confusion.  Over  the last couple weeks, she admits she has had increasing and equilibrium  problems and complaints of dizziness.  She was admitted from the office  for further evaluation.  In the office her blood pressure initially was  204/90 lying and 166/86 standing.  She was admitted to telemetry. Cozaar  was held Cardizem was cut back.  D-dimer was slightly positive.  There  is no pulmonary embolism by CT scan.  The patient was seen in consult by  the Neurology  service.  She did have a cerebral angiogram which showed a  tight focal stenosis in the right posterior cerebral artery.  After  review with the neurologist and interventional radiologist, it was  decided to treat this only with medications.  She did have apparent  episode of PSVT.  Beta blocker was added.  She was ambulated and her  blood pressure was followed.  She would have occasional spikes in her  pressure where systolic would go up to 200, but overall her pressure is  stable at discharge at 140/70.  We plan to discharge her today on the  16th.  She will follow up with Dr. Jenne Campus as an outpatient.   DISCHARGE MEDICATIONS:  1. Cardizem has been cut back to 120 mg a day.  2. Clonidine has been cut back to 0.1 mg twice a day.  3. She will continue her in Namenda 10 mg q.h.s.  4. Two baby aspirins a day.  5. Metoprolol 25 mg twice a day has been added.  6. She can take her  vitamins as taken prior to admission.   We have stopped her and amitriptyline and Cozaar for now.  She will see  Dr. Jenne Campus and Dr. Barbee Shropshire in follow-up.   LABS AT DISCHARGE:  White count 7.2, hemoglobin 12.2, hematocrit 36.4,  platelets 290, INR 1, sodium is 130, potassium 3.8, BUN 18, creatinine  0.8.  Liver functions were normal.  CK-MB and troponins are negative x3.  TSH 1.80.  Urinalysis is unremarkable. EKG shows sinus rhythm at  discharge with nonspecific interventricular block.  Echocardiogram done  this admission shows overall normal LV function with mild aortic valve  calcification but no AS.   DISPOSITION:  The patient discharged in stable condition and will follow-  up with Dr. Jenne Campus.      Abelino Derrick, P.A.      Darlin Priestly, MD  Electronically Signed    LKK/MEDQ  D:  11/23/2005  T:  11/23/2005  Job:  [id number]   cc:   Darlin Priestly, MD  Olene Craven, M.D.

## 2010-05-26 NOTE — Discharge Summary (Signed)
[name], [name]             ACCOUNT NO.:  [id number]   MEDICAL RECORD NO.:  [id number]          PATIENT TYPE:  INP   LOCATION:  2036                         FACILITY:  [facility]   PHYSICIAN:  Nanetta Batty, M.D.   DATE OF BIRTH:  1918/06/23   DATE OF ADMISSION:  11/05/2007  DATE OF DISCHARGE:  11/06/2007                               DISCHARGE SUMMARY   ADDENDUM   DISCHARGE DIAGNOSES:  1. Recurrent syncope.  2. Labile blood pressure.  3. Orthostatic hypotension, improved.  4. Nonobstructive coronary disease.  5. Dementia.      Darcella Gasman. Annie Paras, N.P.      Nanetta Batty, M.D.  Electronically Signed    LRI/MEDQ  D:  01/06/2008  T:  01/07/2008  Job:  [id number]   cc:   Burnell Blanks, MD  Nanetta Batty, M.D.

## 2010-06-12 ENCOUNTER — Emergency Department (HOSPITAL_COMMUNITY): Payer: Medicare Other

## 2010-06-12 ENCOUNTER — Inpatient Hospital Stay (HOSPITAL_COMMUNITY)
Admission: EM | Admit: 2010-06-12 | Discharge: 2010-06-15 | DRG: 563 | Disposition: A | Discharge status: Skilled Nursing Facility | Payer: Medicare Other | Attending: Family Medicine | Admitting: Family Medicine

## 2010-06-12 DIAGNOSIS — Z79899 Other long term (current) drug therapy: Secondary | ICD-10-CM

## 2010-06-12 DIAGNOSIS — I2510 Atherosclerotic heart disease of native coronary artery without angina pectoris: Secondary | ICD-10-CM | POA: Diagnosis present

## 2010-06-12 DIAGNOSIS — E860 Dehydration: Secondary | ICD-10-CM | POA: Diagnosis present

## 2010-06-12 DIAGNOSIS — I10 Essential (primary) hypertension: Secondary | ICD-10-CM | POA: Diagnosis present

## 2010-06-12 DIAGNOSIS — W1811XA Fall from or off toilet without subsequent striking against object, initial encounter: Secondary | ICD-10-CM | POA: Diagnosis present

## 2010-06-12 DIAGNOSIS — Y92009 Unspecified place in unspecified non-institutional (private) residence as the place of occurrence of the external cause: Secondary | ICD-10-CM

## 2010-06-12 DIAGNOSIS — Z950 Presence of cardiac pacemaker: Secondary | ICD-10-CM

## 2010-06-12 DIAGNOSIS — D72829 Elevated white blood cell count, unspecified: Secondary | ICD-10-CM | POA: Diagnosis not present

## 2010-06-12 DIAGNOSIS — T367X5A Adverse effect of antifungal antibiotics, systemically used, initial encounter: Secondary | ICD-10-CM | POA: Diagnosis present

## 2010-06-12 DIAGNOSIS — R7989 Other specified abnormal findings of blood chemistry: Secondary | ICD-10-CM | POA: Diagnosis present

## 2010-06-12 DIAGNOSIS — Z7982 Long term (current) use of aspirin: Secondary | ICD-10-CM

## 2010-06-12 DIAGNOSIS — R82998 Other abnormal findings in urine: Secondary | ICD-10-CM | POA: Diagnosis present

## 2010-06-12 DIAGNOSIS — F0390 Unspecified dementia without behavioral disturbance: Secondary | ICD-10-CM | POA: Diagnosis present

## 2010-06-12 DIAGNOSIS — S82843A Displaced bimalleolar fracture of unspecified lower leg, initial encounter for closed fracture: Principal | ICD-10-CM | POA: Diagnosis present

## 2010-06-12 DIAGNOSIS — X500XXA Overexertion from strenuous movement or load, initial encounter: Secondary | ICD-10-CM | POA: Diagnosis present

## 2010-06-12 DIAGNOSIS — M1990 Unspecified osteoarthritis, unspecified site: Secondary | ICD-10-CM | POA: Diagnosis present

## 2010-06-12 DIAGNOSIS — I4891 Unspecified atrial fibrillation: Secondary | ICD-10-CM | POA: Diagnosis present

## 2010-06-12 LAB — URINALYSIS, ROUTINE W REFLEX MICROSCOPIC
Bilirubin Urine: NEGATIVE
Ketones, ur: NEGATIVE mg/dL
Nitrite: POSITIVE — AB
Urobilinogen, UA: 1 mg/dL (ref 0.0–1.0)

## 2010-06-12 LAB — COMPREHENSIVE METABOLIC PANEL
ALT: 100 U/L — ABNORMAL HIGH (ref 0–35)
AST: 235 U/L — ABNORMAL HIGH (ref 0–37)
Alkaline Phosphatase: 162 U/L — ABNORMAL HIGH (ref 39–117)
CO2: 28 mEq/L (ref 19–32)
Chloride: 101 mEq/L (ref 96–112)
GFR calc Af Amer: >60 mL/min (ref >60)
GFR calc non Af Amer: >60 mL/min (ref >60)
Potassium: 4.3 mEq/L (ref 3.5–5.1)
Sodium: 136 mEq/L (ref 135–145)
Total Bilirubin: 0.5 mg/dL (ref 0.3–1.2)

## 2010-06-12 LAB — CBC
Hemoglobin: 12.1 g/dL (ref 12.0–15.0)
MCH: 30.4 pg (ref 26.0–34.0)
RBC: 3.98 MIL/uL (ref 3.87–5.11)

## 2010-06-12 LAB — URINE MICROSCOPIC-ADD ON

## 2010-06-13 DIAGNOSIS — F0390 Unspecified dementia without behavioral disturbance: Secondary | ICD-10-CM

## 2010-06-13 DIAGNOSIS — S82899A Other fracture of unspecified lower leg, initial encounter for closed fracture: Secondary | ICD-10-CM

## 2010-06-13 LAB — URINE CULTURE

## 2010-06-13 LAB — COMPREHENSIVE METABOLIC PANEL
ALT: 335 U/L — ABNORMAL HIGH (ref 0–35)
Albumin: 3.2 g/dL — ABNORMAL LOW (ref 3.5–5.2)
Alkaline Phosphatase: 228 U/L — ABNORMAL HIGH (ref 39–117)
BUN: 19 mg/dL (ref 6–23)
Chloride: 97 mEq/L (ref 96–112)
Glucose, Bld: 118 mg/dL — ABNORMAL HIGH (ref 70–99)
Potassium: 4.4 mEq/L (ref 3.5–5.1)
Sodium: 134 mEq/L — ABNORMAL LOW (ref 135–145)
Total Bilirubin: 0.5 mg/dL (ref 0.3–1.2)

## 2010-06-13 LAB — CBC
HCT: 40.6 % (ref 36.0–46.0)
MCV: 91.6 fL (ref 78.0–100.0)
Platelets: 173 10*3/uL (ref 150–400)
RBC: 4.43 MIL/uL (ref 3.87–5.11)
WBC: 12.9 10*3/uL — ABNORMAL HIGH (ref 4.0–10.5)

## 2010-06-14 LAB — CBC
HCT: 35 % — ABNORMAL LOW (ref 36.0–46.0)
Hemoglobin: 11.9 g/dL — ABNORMAL LOW (ref 12.0–15.0)
MCHC: 34 g/dL (ref 30.0–36.0)
RBC: 3.88 MIL/uL (ref 3.87–5.11)
WBC: 10.8 10*3/uL — ABNORMAL HIGH (ref 4.0–10.5)

## 2010-06-14 LAB — COMPREHENSIVE METABOLIC PANEL
Alkaline Phosphatase: 174 U/L — ABNORMAL HIGH (ref 39–117)
BUN: 12 mg/dL (ref 6–23)
Chloride: 96 mEq/L (ref 96–112)
Glucose, Bld: 117 mg/dL — ABNORMAL HIGH (ref 70–99)
Potassium: 3.5 mEq/L (ref 3.5–5.1)
Total Bilirubin: 0.7 mg/dL (ref 0.3–1.2)

## 2010-06-14 LAB — URINE CULTURE
Colony Count: NO GROWTH
Culture  Setup Time: 201206051550
Culture: NO GROWTH

## 2010-06-15 LAB — COMPREHENSIVE METABOLIC PANEL
ALT: 108 U/L — ABNORMAL HIGH (ref 0–35)
CO2: 25 mEq/L (ref 19–32)
Calcium: 7.7 mg/dL — ABNORMAL LOW (ref 8.4–10.5)
Creatinine, Ser: 0.64 mg/dL (ref 0.4–1.2)
GFR calc non Af Amer: >60 mL/min (ref >60)
Glucose, Bld: 95 mg/dL (ref 70–99)
Sodium: 129 mEq/L — ABNORMAL LOW (ref 135–145)

## 2010-06-15 LAB — CBC
HCT: 31.9 % — ABNORMAL LOW (ref 36.0–46.0)
Hemoglobin: 10.6 g/dL — ABNORMAL LOW (ref 12.0–15.0)
MCH: 30.1 pg (ref 26.0–34.0)
MCHC: 33.2 g/dL (ref 30.0–36.0)

## 2010-06-21 NOTE — Discharge Summary (Signed)
[name], [name]             ACCOUNT NO.:  [id number]  MEDICAL RECORD NO.:  [id number]  LOCATION:  5005                         FACILITY:  [facility]  PHYSICIAN:  Santiago Bumpers. Hensel, M.D.DATE OF BIRTH:  1918-06-13  DATE OF ADMISSION:  06/12/2010 DATE OF DISCHARGE:  06/15/2010                              DISCHARGE SUMMARY   PRIMARY CARE PROVIDER:  Burnell Blanks, MD  DISCHARGE DIAGNOSES: 1. Left medial and lateral malleolar fracture. 2. Hypertension. 3. Bacteriuria. 4. Elevated LFTs. 5. Coronary artery disease. 6. Atrial fibrillation, status post pacer. 7. Degenerative joint disease. 8. Dementia.  DISCHARGE MEDICATIONS:  New medications include: 1. Albuterol 2.5/3 mL neb solution 1 neb inhaled q.4 h p.r.n. 2. Tramadol 2 tablets p.o. q.6 h x5 days.  Medications which remain the same include: 1. Artificial tears daily. 2. Aspirin 81 mg 2 tablets p.o. q.a.m. 3. Clobetasol 0.05% solutions 1 application topically b.i.d. p.r.n. to     scalp. 4. Clotrimazole/betamethasone 1 application topically t.i.d. p.r.n. x3     weeks for yeast, started on May 29, 2010. 5. Cozaar 50 mg p.o. q.a.m. 6. Beta Carotene  1 tablet p.o. b.i.d. 7. Keppra 125 mg p.o. q.a.m., 250 mg p.o. q.p.m. 8. Metoprolol 50 mg p.o. b.i.d. 9. Multivitamin daily. 10.Namenda 10 mg p.o. b.i.d. 11.Prilosec 10 mg p.o. q.a.m.  Medications which were stopped during this hospitalization include: 1. Extrea-Strength Tylenol 500 mg p.o. nightly. 2. Lamisil 250 mg p.o.  CONSULTS:  Victoria Broom, MD with Orthopedic Surgery.  PROCEDURES: 1. Plain film of ankle on June 12, 2010, showing nondisplaced medial     and lateral malleolar fracture with associated soft tissue     swelling. 2. Complete left knee x-ray showing no fracture dislocation or joint     effusion.  LABORATORY DATA:  On admission, the patient's CBC was within normal limits with a white count of 9.3, hemoglobin of 12.1.  CMET was significant for alk  phos of 162, AST 235, ALT 100, albumin 2.8 and calcium 8.0.  The patient's CMET sodium went from 136 to 134 to 131, potassium 4.3 to 4.4 to 3.5, alk phos 162 to 228 to 174, AST 235 to 390 to 108, ALT 100 to 335 to 172.  Urinalysis on admission was amber and turbid with a large blood, 30 protein, positive nitrite, large leukocyte, few epithelial cells, triple phosphate crystals, 11-20 wbc's, too numerous to count rbc's and many bacteria.  Urine culture showing greater than 100,000 colonies of multiple bacterial morphotypes present, nonpredominant.  BRIEF HOSPITAL COURSE:  This is an 77 year old female presenting with left ankle fracture. 1. Ankle fracture.  Orthopedic surgery was consulted.  After     discussion with the family and evaluation of the patient, it was     decided that conservative management was the most reasonable     initial approach to this fracture.  Fracture was reduced.  The     patient is to have nonweightbearing physical therapy.  She will be     seen by Orthopedic again in 7-10 days for repeat x-ray to evaluate     if the patient is holding the reduction which was done.  If so at  that time she will have cast immobilization; however, if the     patient looses reduction then surgical fixation may need to be     considered at that time.  Physical therapy was started which     recommended SNF placement.  For pain control, the patient was     initially placed on tramadol p.r.n.  However, secondary to the     patient's dementia, it was felt that she would do better with     scheduled medicine, therefore Ultracet was started and will be     scheduled for the next 5 days.  At that time pain control can go     back to p.r.n. 2. Dementia.  The patient was at her baseline according to her     daughter.  She was continued on her home Namenda.  No acute     delirious events occurred while in-house. 3. Elevated LFTs.  This is most likely due to the patient being on      Lamisil.  They were starting to improve on the day of discharge.     Due to these elevated LFTs, we were not comfortable, was starting     scheduled Tylenol alone, however, it was felt that since they were     improving scheduling Ultracet would be okay in the face of an acute     increase in LFT with likely known source.  LFTs will continue to be     monitored as they were to begin drastically increasing again,     Ultracet would be discontinued and tramadol only can be scheduled     for pain control. 4. Bacteriuria.  Initial urinalysis is very concerning for urinary     tract infection.  After discussing with the patient's daughter     every time that the patient is in-house, she is treated for urinary     tract infection likely this is just due to colonization as the     patient has been afebrile without any symptoms of urinary tract     infection.  Urine culture grew out greater than 100,000 colonies of     multiple bacterial without one predominant, which also is     consistent with a colonization-type picture.  A second urine     culture was pending at the time of discharge, which will likely     show a continued poly organism morphology. 5. Hypertension.  The patient with known hypertensive disease.  She     was continued on her home losartan and metoprolol.  She was written     for hydralazine p.r.n., however, when the patient's blood pressures     were elevated, they were often with a wide blood pressure for     instant 201/69 on the day prior to discharge.  This is most likely     due to atherosclerotic disease.  Her blood pressure medications     could be titrated up slowly by her primary care provider, however,     at this time we did not feel that any acute changes needed to be     done. 6. Atrial fibrillation.  The patient is status post pacer and on     metoprolol for rate control.  Heart rate remained controlled in the     70s throughout hospitalization.  DISCHARGE  INSTRUCTIONS:  The patient was instructed to increase activity slowly, walk with assistance, nonweightbearing on her left leg to use crutches and/or walker  and that she may bathe.  She was sent out with and no dietary restrictions.  FOLLOWUP:  The patient is to follow up with Dr. Ave Filter with Orthopedic Surgery in 7-10 days and the patient also should follow up with her primary care physician, Dr. Nathanial Rancher.  The patient's SNF will set up this appointment.  DISCHARGE CONDITION:  The patient was discharged to SNF in stable medical condition with Physical Therapy in place and close follow up with Orthopedic Surgery.    ______________________________ Demetria Pore, MD   ______________________________ Santiago Bumpers. Leveda Anna, M.D.    JM/MEDQ  D:  06/14/2010  T:  06/14/2010  Job:  [id number]  cc:   Burnell Blanks, MD Victoria Broom, MD  Electronically Signed by Demetria Pore MD on 06/19/2010 11:42:52 AM Electronically Signed by Doralee Albino M.D. on 06/21/2010 04:54:09 AM

## 2010-07-25 NOTE — H&P (Signed)
NAMEIRVING, LUBBERS             ACCOUNT NO.:  [id number]  MEDICAL RECORD NO.:  [id number]  LOCATION:  5005                         FACILITY:  [facility]  PHYSICIAN:  Santiago Bumpers. Hensel, M.D.DATE OF BIRTH:  05-30-1918  DATE OF ADMISSION:  06/12/2010 DATE OF DISCHARGE:                             HISTORY & PHYSICAL   CHIEF COMPLAINT:  Fall, ankle fracture.  HISTORY OF PRESENT ILLNESS:  This patient has sustained a fall today while getting off the toilet.  She twisted her left ankle and fell.  She denied any head injuries, denied any dizziness or loss of consciousness. Denied any headache or numbness.  She walks at baseline with a walker and has some baseline mobility issues.  Family feels like they would be unable to care for her if she were in a wheelchair due to the set up of her home and the care she would need.  Orthopedic will see her, but have currently recommend a brace.  She will be admitted for pain control, orthopedic intervention as well as placement for rehabilitation.  ALLERGIES:  PENICILLIN, CODEINE, LATEX, AMOXICILLIN, NORVASC, ADHESIVE TAPE, BETA-BLOCKERS.  MEDICATIONS:  Med rec pending per pharmacy.  PAST MEDICAL HISTORY: 1. CAD. 2. Hypertension. 3. AFib. 4. DJD. 5. Dementia. 6. Syncope.  PAST SURGICAL HISTORY:  Appendectomy.  SOCIAL HISTORY:  Lives with son and his wife.  Does not smoke, drink, or use any drugs.  FAMILY HISTORY:  No significant medical issues.  REVIEW OF SYSTEMS:  Denies fevers, chills, headache, sore throat, chest pain, edema, orthopnea, cough, dyspnea, wheezing, nausea, vomiting, diarrhea, dysuria, rash, visual changes, or bleeding, complaints of arthralgias.  PHYSICAL EXAMINATION:  VITAL SIGNS:  Temperature 98.4, pulse 64, respirations 18, blood pressure 175/85, sating 97% on room air. GENERAL:  She is in no acute distress.  She is resting on the stretcher. HEENT:  Moist mucous membranes.  Pupils equal, round, and reactive  to light. NECK:  Supple. CARDIOVASCULAR:  Irregular rhythm.  No murmur.  Normal rate. LUNGS:  Clear to auscultation bilaterally. ABDOMEN:  Soft, nontender. EXTREMITIES:  Warm.  Pulses present bilaterally on lower extremities. NEUROLOGIC:  Cranial nerves II through XII are intact.  Grip strength equal bilaterally.  LABS AND STUDIES:  BMET was significant for a creatinine of 0.73, an alk phos 162, AST 235, ALT 100.  CBC was significant for a hemoglobin of 12.1 with a white count of 9.3, platelets 163.  A left ankle film showed medial and lateral malleolar fractures.  Knee film was negative.  ASSESSMENT AND PLAN:  This is an 77 year old female status post ankle fracture here for placement rehabilitation, possible orthopedic intervention. 1. Ankle fracture.  Orthopedics to see the patient and give     recommendations.  We will have PT/OT see and evaluate for skilled     nursing facility after Orthopedics seen her.  Fall does not seem to     be neurologic or cardiogenic in nature. 2. Atrial fibrillation, currently rate controlled.  Continue home     medications. 3. Hypertension.  Home medications.  Currently elevated possibly due     to pain.  We will monitor. 4. Dementia.  Continue Namenda. 5. Fluids, electrolytes, nutrition/gastrointestinal.  Regular  diet.     No IV fluids unless n.p.o. 6. Elevated LFTs, likely due to the Lamisil she has been taking for     her yeast infection.  We will hold Lamisil, hold Tylenol, and check     LFTs in the morning. 7. Disposition, pending placement at the skilled nursing facility,     improvement in mobility, and any orthopedic intervention.     Ellery Plunk, MD   ______________________________ Santiago Bumpers Leveda Anna, M.D.    RS/MEDQ  D:  06/12/2010  T:  06/13/2010  Job:  [id number]  cc:   Burnell Blanks, MD  Electronically Signed by Ellery Plunk  on 07/19/2010 11:41:51 AM Electronically Signed by Doralee Albino M.D. on 07/25/2010 07:45:35  AM

## 2010-08-01 NOTE — Consult Note (Signed)
  [name], [name]             ACCOUNT NO.:  [id number]  MEDICAL RECORD NO.:  [id number]  LOCATION:  5005                         FACILITY:  [facility]  PHYSICIAN:  Jones Broom, MD    DATE OF BIRTH:  1918-12-25  DATE OF CONSULTATION: DATE OF DISCHARGE:  06/13/2010                                CONSULTATION   REASON FOR CONSULTATION:  Evaluation of left ankle fracture.  HISTORY OF PRESENT ILLNESS:  [name] is a very pleasant mildly demented 77 year old female who had a fall getting off the toilet last night.  She suffered a left ankle fracture and was seen in the [facility] Emergency Department.  I was consulted for evaluation and management of are ankle fracture.  She was admitted for social reasons.  She was with her daughter today.  She lives with her daughter and has somebody with her 24 hours a day.  PAST MEDICAL HISTORY:  Significant for CAD, hypertension, atrial fibrillation, DJD, dementia, and syncope.  MEDICATIONS:  Reviewed on the med rec.  ALLERGIES:  PENICILLIN, CODEINE, LATEX, AMOXICILLIN, NORVASC, ADHESIVE TAPE, and BETA-BLOCKERS.  PAST SURGICAL HISTORY:  Appendectomy.  SOCIAL HISTORY:  She lives with her son and his wife.  She does not smoke, drink, or use any drugs.  REVIEW OF SYSTEMS:  As above, otherwise negative.  PHYSICAL EXAMINATION:  VITAL SIGNS:  Temperature 98.4, pulse 64, respiratory rate 18, blood pressure 175/85, satting 97% on room air. GENERAL:  She is awake, alert, pleasantly confused.  She is resting IN her hospital bed. EXTREMITIES:  Exam of bilateral upper extremities demonstrates no tenderness to palpation or pain with range of motion.  Examination of her left lower extremity demonstrates a well-fitting posterior splint. Toes are warm and well-perfused.  She can wiggle her toes up and down. She has normal sensation to light touch on the dorsal and plantar aspect of the toes.  DIAGNOSTIC STUDIES:  X-rays including three views of left  ankle demonstrate a bimalleolar ankle fracture with minimal displacement.  The mortise appears anatomically reduced.  IMPRESSION AND PLAN:  An 77 year old female with left ankle bimalleolar fracture with no displacement.  PLAN:  I spoke at length with her daughter about the injury.  This is technically an unstable ankle injury.  However, I think given her age and status that we could try to treat this without surgery with cast immobilization.  Her daughter understands that if this heals slightly displaced, may predispose her to arthritis in the ankle, but she would rather deal with that than deal with surgery at this point.  Right now, she is in a well-fitting posterior splint.  We will get her up with physical therapy, nonweightbearing.  She will likely need a skilled nursing facility.  I will see her back in 7-10 days to re- x-ray her and transition her to a cast as long as she is holding the reduction.  If she loses reduction, we may need to consider surgical fixation.     Jones Broom, MD     JC/MEDQ  D:  06/13/2010  T:  06/14/2010  Job:  [id number]  Electronically Signed by Jones Broom  on 08/01/2010 03:54:56 PM

## 2010-10-10 LAB — URINALYSIS, ROUTINE W REFLEX MICROSCOPIC
Bilirubin Urine: NEGATIVE
Hgb urine dipstick: NEGATIVE
Ketones, ur: NEGATIVE
Nitrite: NEGATIVE
Urobilinogen, UA: 0.2

## 2010-10-10 LAB — POCT CARDIAC MARKERS
CKMB, poc: 1.7
CKMB, poc: 1.8
Myoglobin, poc: 69.2
Troponin i, poc: <0.05
Troponin i, poc: <0.05

## 2010-10-10 LAB — COMPREHENSIVE METABOLIC PANEL
ALT: 18
Albumin: 3.7
Alkaline Phosphatase: 68
BUN: 23
Chloride: 101
Glucose, Bld: 150 — ABNORMAL HIGH
Potassium: 4
Sodium: 136
Total Bilirubin: 0.8
Total Protein: 6.7

## 2010-10-10 LAB — DIFFERENTIAL
Basophils Relative: 0
Eosinophils Relative: 3
Lymphocytes Relative: 18
Monocytes Relative: 7
Neutrophils Relative %: 72

## 2010-10-10 LAB — POCT I-STAT, CHEM 8
BUN: 27 — ABNORMAL HIGH
Chloride: 102
Creatinine, Ser: 1.4 — ABNORMAL HIGH
Potassium: 4.5
Sodium: 138

## 2010-10-10 LAB — CARDIAC PANEL(CRET KIN+CKTOT+MB+TROPI)
CK, MB: 2.4
CK, MB: 2.5
Total CK: 63
Troponin I: 0.01
Troponin I: 0.02

## 2010-10-10 LAB — CBC
MCV: 92.1
RBC: 4.23
WBC: 7.7

## 2010-10-10 LAB — BASIC METABOLIC PANEL
Chloride: 102
Creatinine, Ser: 1.14
GFR calc Af Amer: 55 — ABNORMAL LOW
GFR calc non Af Amer: 45 — ABNORMAL LOW

## 2010-10-10 LAB — TSH: TSH: 1.342

## 2010-10-10 LAB — PROTIME-INR: INR: 1

## 2010-10-10 LAB — CK TOTAL AND CKMB (NOT AT ARMC): CK, MB: 2.6

## 2010-10-20 LAB — COMPREHENSIVE METABOLIC PANEL
Alkaline Phosphatase: 64
BUN: 17
Chloride: 96
Creatinine, Ser: 1.17
GFR calc non Af Amer: 44 — ABNORMAL LOW
Glucose, Bld: 125 — ABNORMAL HIGH
Potassium: 3.5
Total Bilirubin: 0.9

## 2010-10-20 LAB — URINALYSIS, ROUTINE W REFLEX MICROSCOPIC
Glucose, UA: NEGATIVE
Hgb urine dipstick: NEGATIVE
Ketones, ur: NEGATIVE
pH: 5.5

## 2010-10-20 LAB — BASIC METABOLIC PANEL
BUN: 17
CO2: 27
Calcium: 8.8
Chloride: 101
Creatinine, Ser: 1.13
GFR calc Af Amer: 55 — ABNORMAL LOW
GFR calc Af Amer: 58 — ABNORMAL LOW
GFR calc non Af Amer: 48 — ABNORMAL LOW
GFR calc non Af Amer: 58 — ABNORMAL LOW
Glucose, Bld: 102 — ABNORMAL HIGH
Potassium: 3.3 — ABNORMAL LOW
Potassium: 3.7
Sodium: 134 — ABNORMAL LOW
Sodium: 135

## 2010-10-20 LAB — CBC
HCT: 35.9 — ABNORMAL LOW
HCT: 38
HCT: 39.1
HCT: 39.3
Hemoglobin: 12.4
Hemoglobin: 13.4
Hemoglobin: 13.4
MCHC: 34
MCHC: 34.3
MCHC: 34.6
MCV: 94.1
Platelets: 228
Platelets: 237
Platelets: 245
RBC: 3.78 — ABNORMAL LOW
RDW: 13.7
RDW: 13.7
RDW: 13.9
RDW: 13.9
WBC: 12.2 — ABNORMAL HIGH

## 2010-10-20 LAB — DIFFERENTIAL
Basophils Absolute: 0
Basophils Absolute: 0
Basophils Relative: 0
Eosinophils Absolute: 0
Eosinophils Relative: 0
Eosinophils Relative: 2
Lymphocytes Relative: 10 — ABNORMAL LOW
Lymphocytes Relative: 13
Lymphocytes Relative: 18
Lymphs Abs: 1.6
Monocytes Absolute: 0.6
Monocytes Absolute: 1 — ABNORMAL HIGH
Monocytes Relative: 8
Neutro Abs: 9.4 — ABNORMAL HIGH
Neutrophils Relative %: 77

## 2010-10-20 LAB — CARDIAC PANEL(CRET KIN+CKTOT+MB+TROPI)
CK, MB: 0.9
CK, MB: 0.9
Relative Index: INVALID
Total CK: 27
Troponin I: 0.02

## 2010-10-20 LAB — MAGNESIUM: Magnesium: 2.1

## 2010-10-20 LAB — URINE CULTURE: Colony Count: >=100000

## 2010-10-20 LAB — POCT CARDIAC MARKERS
Operator id: 196461
Troponin i, poc: <0.05

## 2010-10-20 LAB — POCT I-STAT CREATININE
Creatinine, Ser: 1.4 — ABNORMAL HIGH
Operator id: 196461

## 2010-10-20 LAB — I-STAT 8, (EC8 V) (CONVERTED LAB)
Acid-Base Excess: 2
BUN: 17
Chloride: 97
HCT: 43
Hemoglobin: 14.6
Operator id: 196461
Sodium: 132 — ABNORMAL LOW
pCO2, Ven: 43.6 — ABNORMAL LOW

## 2010-10-20 LAB — TSH: TSH: 3.674

## 2010-10-20 LAB — URINE MICROSCOPIC-ADD ON

## 2010-10-20 LAB — PROTIME-INR: Prothrombin Time: 12.6

## 2010-10-20 LAB — AMYLASE: Amylase: 55

## 2011-08-18 IMAGING — CR DG CHEST 1V PORT
1 series · 1 of 1 positions shown · non-contrast
Comparison: Two-view chest x-ray 05/13/2008 and 04/04/2008.

CLINICAL DATA: Syncope.

PORTABLE CHEST - 1 VIEW [DATE]/[id number] hours:

[AP]
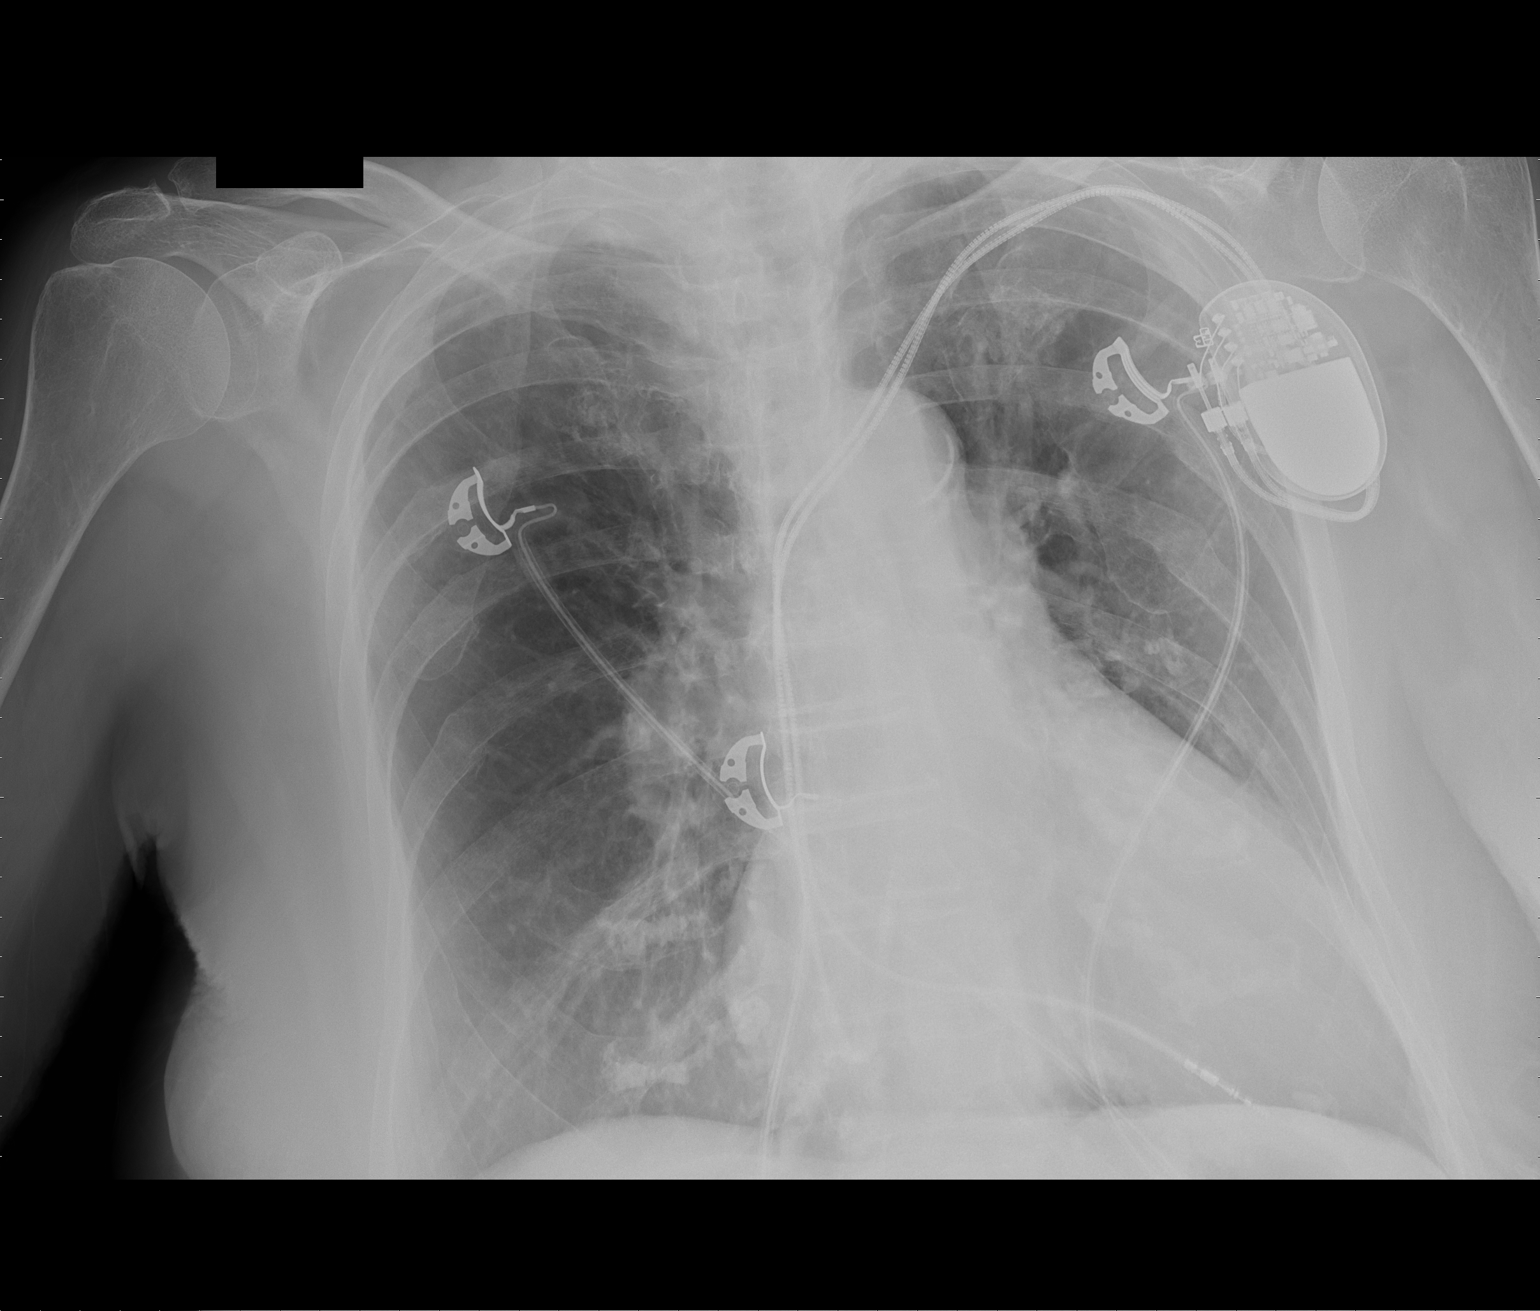

[1 of 1 positions shown; findings below may reference images not displayed]

FINDINGS: Heart enlarged but stable.  Left subclavian dual lead
transvenous pacemaker unchanged in intact.  Mild pulmonary venous
hypertension without overt edema.  Lungs clear.  No visible pleural
effusions.  Prominent costal cartilage calcifications.
IMPRESSION: Stable cardiomegaly.  No acute cardiopulmonary disease.

## 2011-08-20 IMAGING — CR DG CHEST 1V PORT
1 series · 1 of 1 positions shown · non-contrast
Comparison: 05/27/2009

CLINICAL DATA: Chest pain

PORTABLE CHEST - 1 VIEW

[view not recorded]
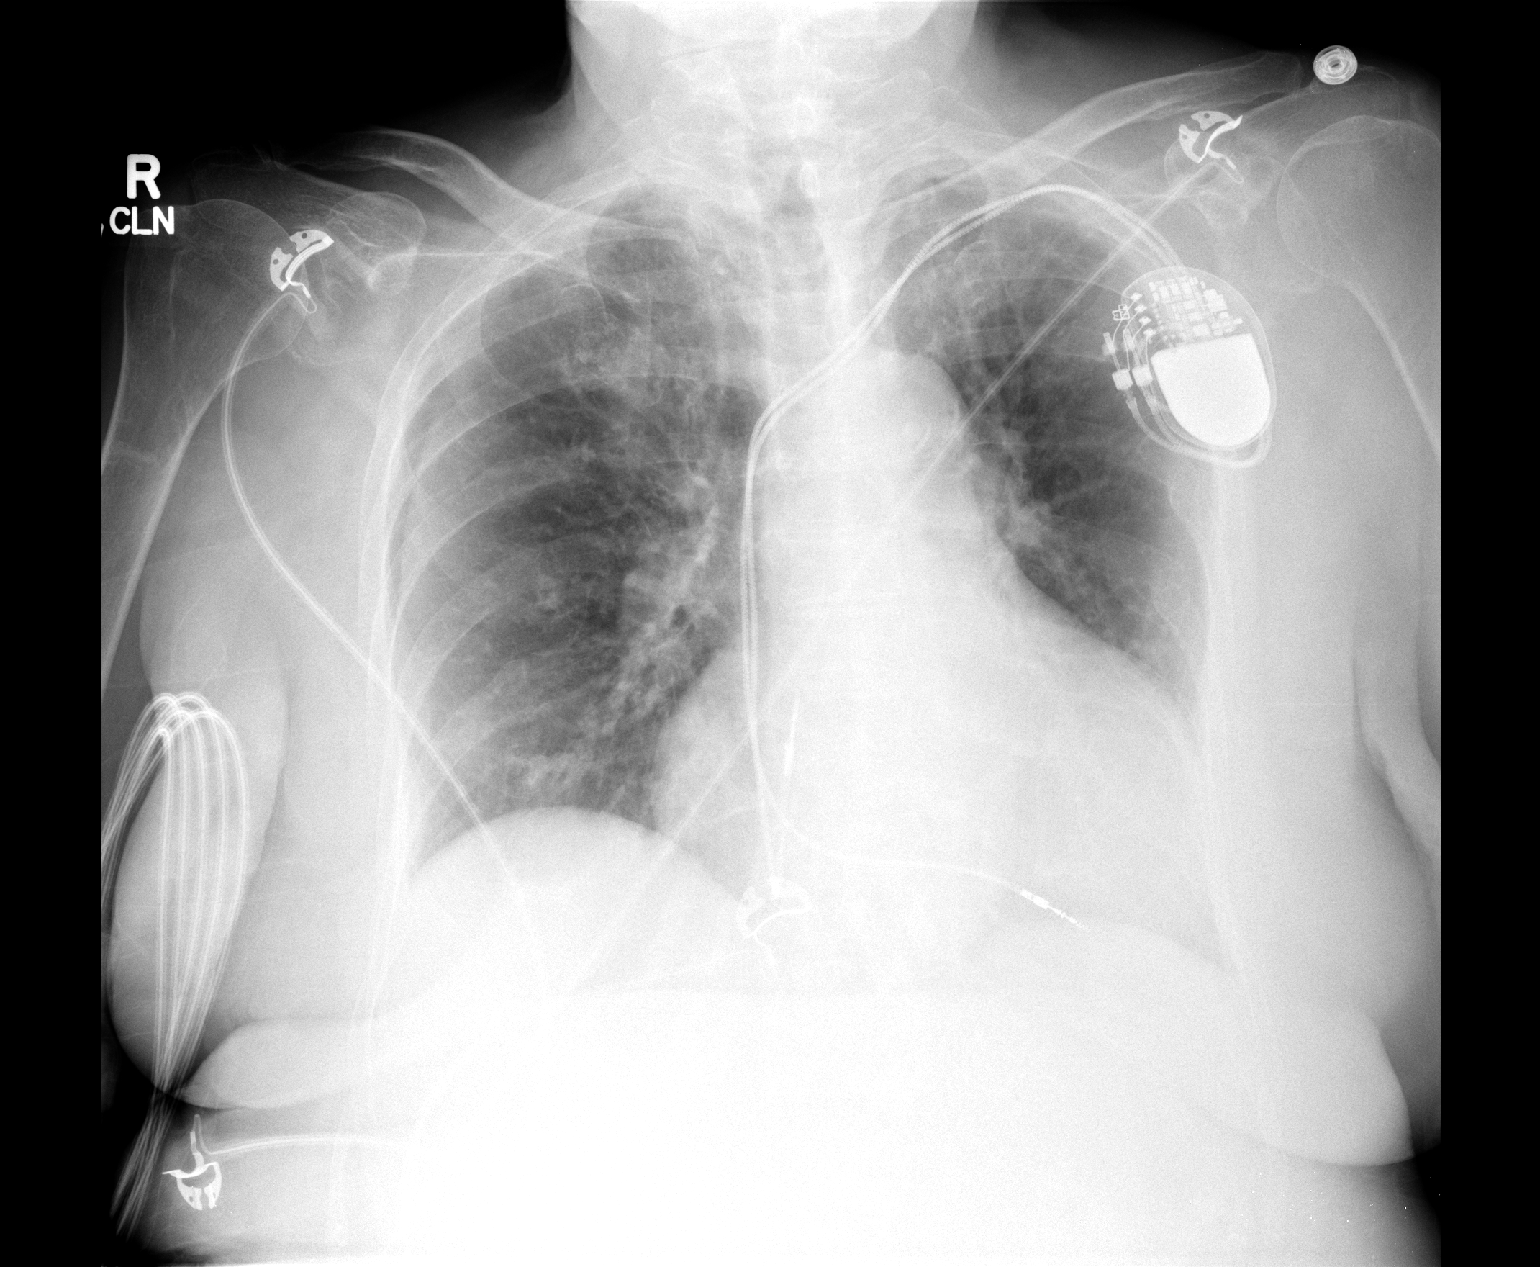

[1 of 1 positions shown; findings below may reference images not displayed]

FINDINGS: Left subclavian pacemaker stable in position.  Stable
mild cardiomegaly.  Atheromatous aortic arch.  Probable skin fold
projects over the lateral right hemithorax.  Lungs are clear.  No
effusion.  Old left rib fractures.
IMPRESSION: 1.  Stable mild cardiomegaly, postop and post-traumatic changes.
2.  No acute disease.

## 2011-08-20 IMAGING — US US ABDOMEN COMPLETE
1 series · 13 of 25 positions shown · non-contrast
Comparison: CT abdomen 11/18/2005

CLINICAL DATA: Unstable angina, abnormal LFTs

COMPLETE ABDOMINAL ULTRASOUND

[Series 1: us abdomen complete · 0.28mm/px · 13 of 67 slices shown]
[im 1/67]
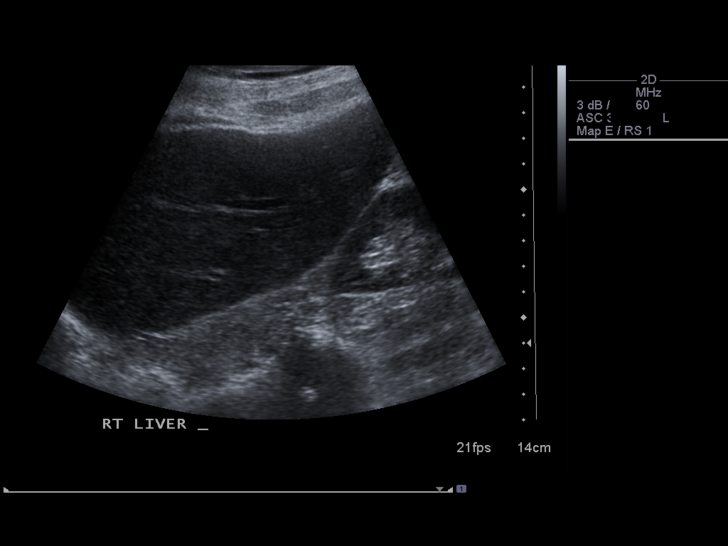
[im 6/67]
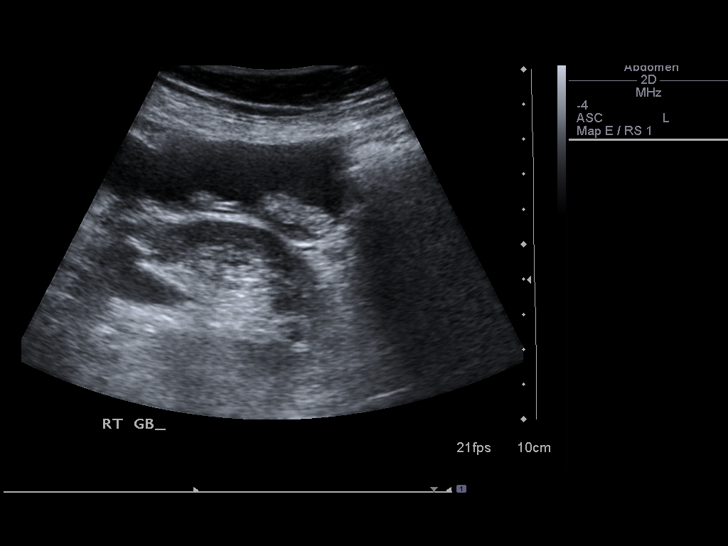
[im 12/67]
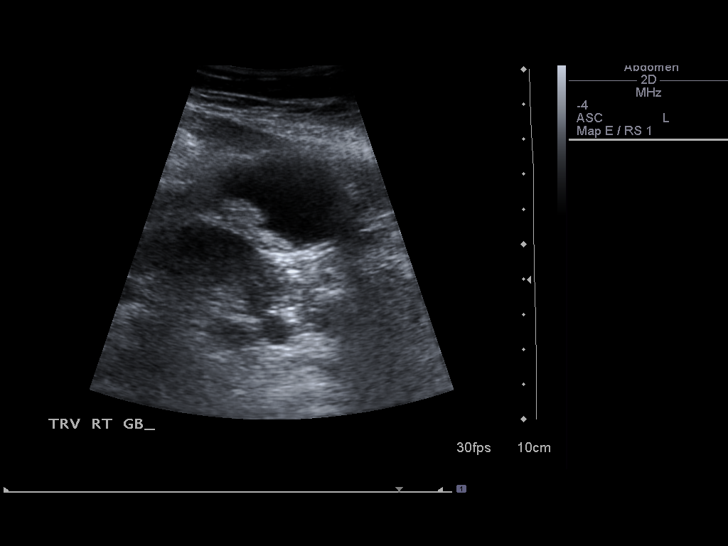
[im 17/67]
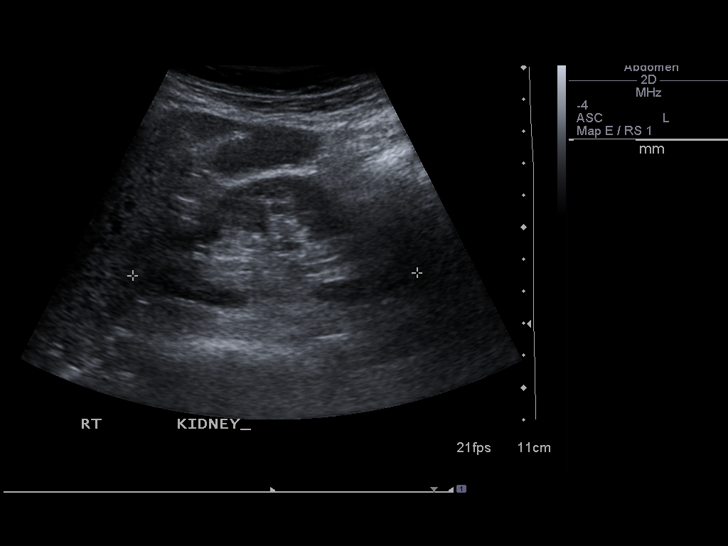
[im 23/67]
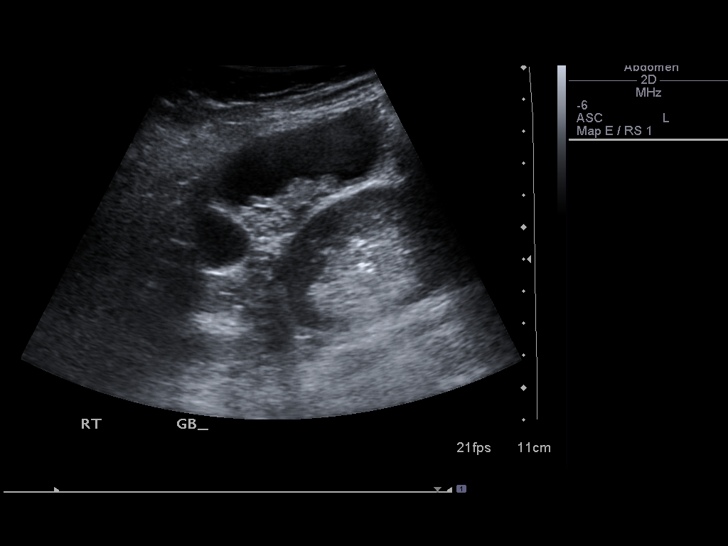
[im 28/67]
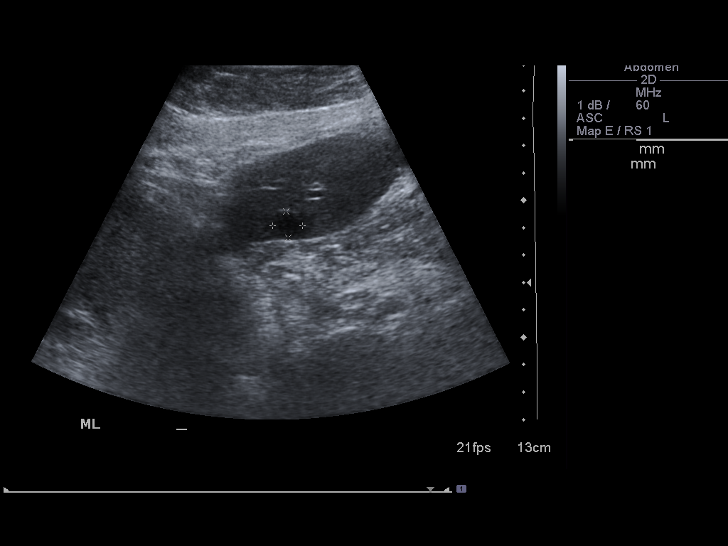
[im 34/67]
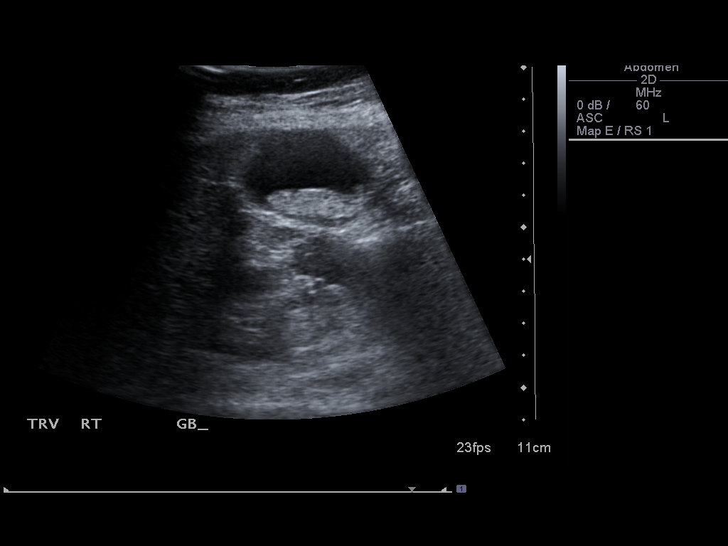
[im 39/67]
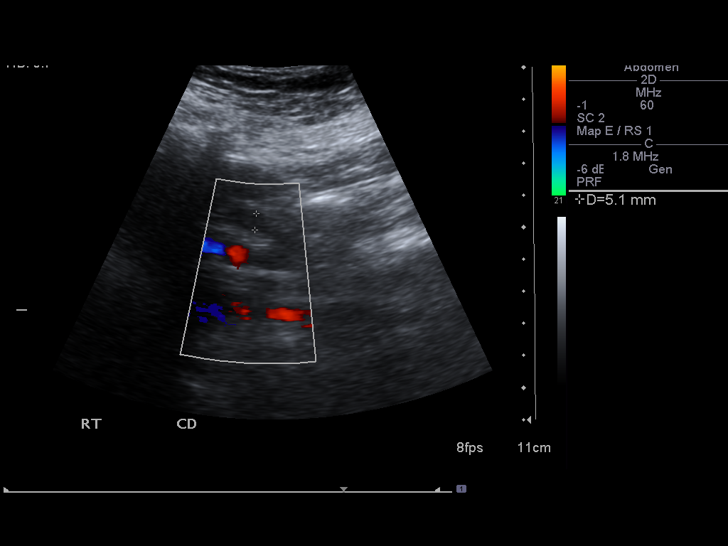
[im 45/67]
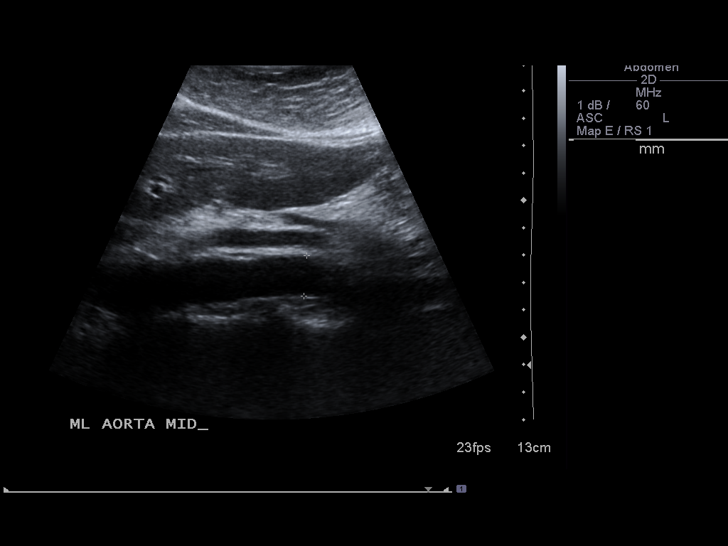
[im 50/67]
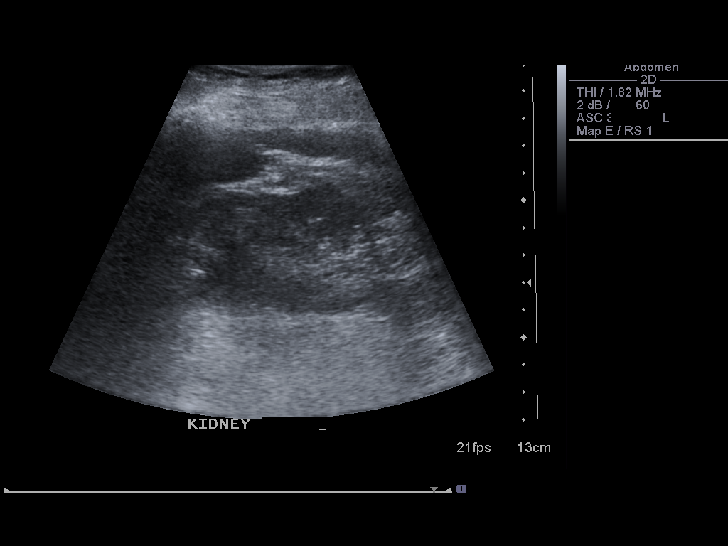
[im 56/67]
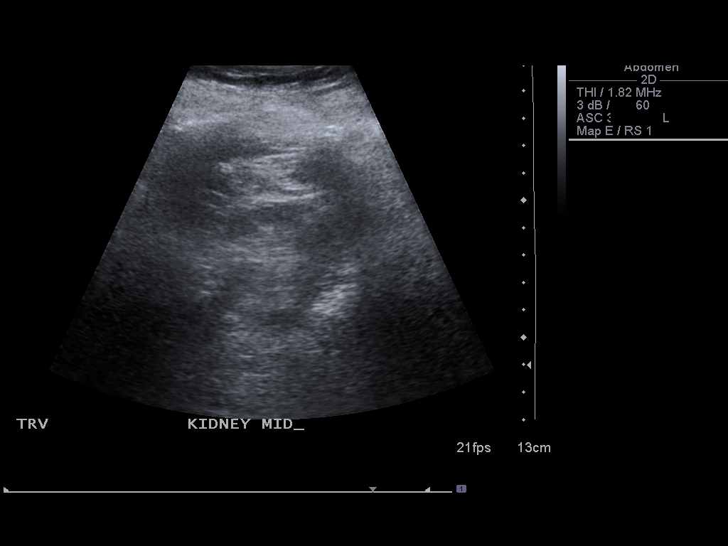
[im 61/67]
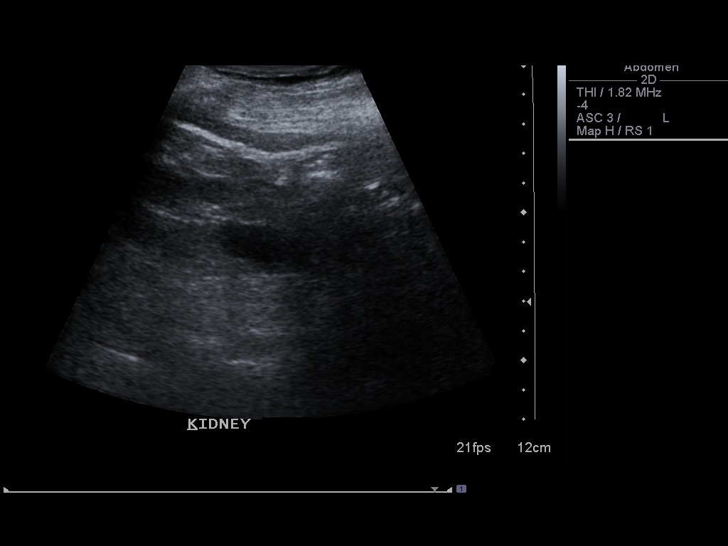
[im 67/67]
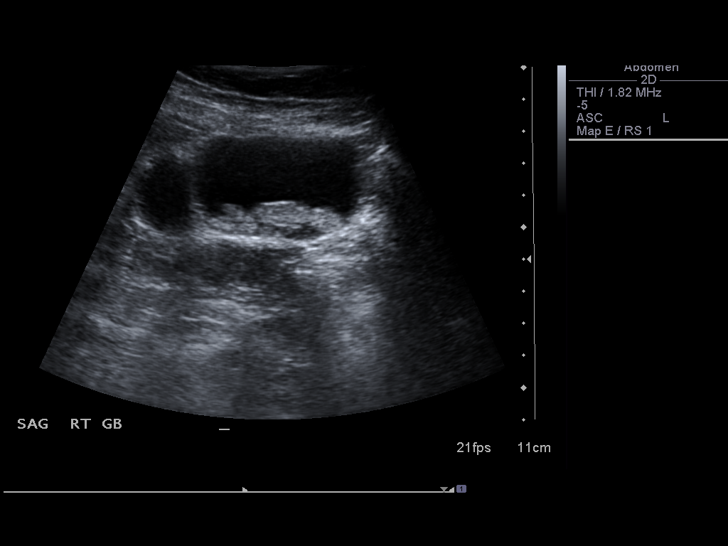

[13 of 25 positions shown; findings below may reference images not displayed]

FINDINGS: Gallbladder:  No evidence of gallbladder wall thickening.  No
pericholecystic fluid.  Negative sonographic Murphy's sign.  There
is dependent lobular echogenic material within the gallbladder
which likely represents sludge.  No shadowing of this lobular
material.

Common bile duct:  Upper limits of normal at 6 mm

Liver:  There is a dense echotexture of the liver.  Hypoechoic
focus within the left hepatic lobe measures 1 cm.

IVC:  Appears normal.

Pancreas:  No focal abnormality seen.

Spleen:  Normal in size and contour

Right Kidney:  8.9 cm in length.  No evidence of hydronephrosis or
stones.

Left Kidney:  10.4cm in length.  There is a cystic lesion in the
lower pole measuring 2.3 cm with a somewhat irregular borders.

Abdominal aorta:  No aneurysm identified.
IMPRESSION: 1.  No evidence acute cholecystitis.
2.  Lobular material within the gallbladder is likely sludge.
3.  Hypoechoic lesion in the liver corresponds simple cyst on
comparison CT.
4.  Hyperechoic lesion in the left kidney is not present on
comparison CT.  Recommend MRI with contrast further evaluation.  If
the patient is a poor candidate for MRI (i.e. cannot hold breath
well) recommend a CT renal protocol.
5.  Echogenic liver commonly represent hepatic steatosis.  Cannot
exclude  hepatocellular disease.

## 2012-08-28 ENCOUNTER — Encounter: Payer: Self-pay | Admitting: *Deleted

## 2012-09-01 ENCOUNTER — Encounter: Payer: Self-pay | Admitting: Cardiovascular Disease

## 2012-09-02 ENCOUNTER — Ambulatory Visit: Payer: Medicare Other | Admitting: Cardiovascular Disease

## 2012-09-04 ENCOUNTER — Telehealth: Payer: Self-pay | Admitting: Cardiovascular Disease

## 2012-09-04 ENCOUNTER — Other Ambulatory Visit: Payer: Self-pay | Admitting: *Deleted

## 2012-09-04 MED ORDER — METOPROLOL TARTRATE 50 MG PO TABS: 50.0000 mg | ORAL_TABLET | Freq: Two times a day (BID) | ORAL | Status: DC

## 2012-09-04 NOTE — Telephone Encounter (Signed)
[name] has an appt next week-not sure she will be able to make it.

## 2012-09-04 NOTE — Telephone Encounter (Signed)
Rx was sent to pharmacy electronically. 

## 2012-09-09 ENCOUNTER — Ambulatory Visit: Payer: Medicare Other | Admitting: Cardiovascular Disease

## 2012-10-06 ENCOUNTER — Other Ambulatory Visit: Payer: Self-pay | Admitting: *Deleted

## 2012-10-06 MED ORDER — METOPROLOL TARTRATE 50 MG PO TABS: 50.0000 mg | ORAL_TABLET | Freq: Two times a day (BID) | ORAL | Status: AC

## 2013-01-27 ENCOUNTER — Telehealth: Payer: Self-pay

## 2013-01-27 NOTE — Telephone Encounter (Signed)
[facility] past [facility] @ [name]

## 2013-02-08 DEATH — deceased
